# Patient Record
Sex: Male | Born: 1965 | Race: White | Hispanic: No | Marital: Married | Smoking: Never smoker
Health system: Southern US, Community
[De-identification: ages and names within clinical notes are randomized; demographics above are authoritative.]

## PROBLEM LIST (undated history)

## (undated) DIAGNOSIS — Z86711 Personal history of pulmonary embolism: Secondary | ICD-10-CM

## (undated) DIAGNOSIS — I82409 Acute embolism and thrombosis of unspecified deep veins of unspecified lower extremity: Secondary | ICD-10-CM

## (undated) DIAGNOSIS — I4891 Unspecified atrial fibrillation: Secondary | ICD-10-CM

## (undated) HISTORY — PX: VASECTOMY: SHX75

## (undated) HISTORY — PX: NASAL FRACTURE SURGERY: SHX718

---

## 2006-07-23 ENCOUNTER — Other Ambulatory Visit: Payer: Self-pay

## 2006-07-23 ENCOUNTER — Emergency Department: Payer: Self-pay | Admitting: Emergency Medicine

## 2008-02-17 ENCOUNTER — Emergency Department: Payer: Self-pay | Admitting: Emergency Medicine

## 2010-11-05 ENCOUNTER — Emergency Department: Payer: Self-pay | Admitting: Emergency Medicine

## 2013-04-14 ENCOUNTER — Ambulatory Visit: Payer: Self-pay | Admitting: Family Medicine

## 2015-07-21 ENCOUNTER — Other Ambulatory Visit: Payer: Self-pay | Admitting: Family Medicine

## 2015-07-21 DIAGNOSIS — G8929 Other chronic pain: Secondary | ICD-10-CM

## 2015-07-21 DIAGNOSIS — M25512 Pain in left shoulder: Secondary | ICD-10-CM

## 2015-08-10 ENCOUNTER — Ambulatory Visit: Payer: Self-pay

## 2015-08-17 ENCOUNTER — Encounter: Payer: Self-pay | Admitting: Emergency Medicine

## 2015-08-17 ENCOUNTER — Emergency Department: Payer: 59

## 2015-08-17 ENCOUNTER — Emergency Department
Admission: EM | Admit: 2015-08-17 | Discharge: 2015-08-17 | Disposition: A | Discharge status: Home / Self Care | Payer: 59 | Attending: Emergency Medicine | Admitting: Emergency Medicine

## 2015-08-17 DIAGNOSIS — Y998 Other external cause status: Secondary | ICD-10-CM | POA: Insufficient documentation

## 2015-08-17 DIAGNOSIS — S299XXA Unspecified injury of thorax, initial encounter: Secondary | ICD-10-CM | POA: Diagnosis present

## 2015-08-17 DIAGNOSIS — S20211A Contusion of right front wall of thorax, initial encounter: Secondary | ICD-10-CM | POA: Insufficient documentation

## 2015-08-17 DIAGNOSIS — Y9389 Activity, other specified: Secondary | ICD-10-CM | POA: Insufficient documentation

## 2015-08-17 DIAGNOSIS — Y9289 Other specified places as the place of occurrence of the external cause: Secondary | ICD-10-CM | POA: Insufficient documentation

## 2015-08-17 DIAGNOSIS — W132XXA Fall from, out of or through roof, initial encounter: Secondary | ICD-10-CM | POA: Diagnosis not present

## 2015-08-17 MED ORDER — HYDROCODONE-ACETAMINOPHEN 5-325 MG PO TABS
2.0000 | ORAL_TABLET | Freq: Once | ORAL | Status: AC
  Administered 2015-08-17: 2 via ORAL
  Filled 2015-08-17: qty 2

## 2015-08-17 MED ORDER — HYDROCODONE-ACETAMINOPHEN 5-325 MG PO TABS: 1.0000 | ORAL_TABLET | ORAL | Status: DC | PRN

## 2015-08-17 MED ORDER — IBUPROFEN 800 MG PO TABS: 800.0000 mg | ORAL_TABLET | Freq: Three times a day (TID) | ORAL | Status: DC | PRN

## 2015-08-17 MED ORDER — KETOROLAC TROMETHAMINE 60 MG/2ML IM SOLN
60.0000 mg | Freq: Once | INTRAMUSCULAR | Status: AC
  Administered 2015-08-17: 60 mg via INTRAMUSCULAR
  Filled 2015-08-17: qty 2

## 2015-08-17 NOTE — Discharge Instructions (Signed)
Rib Contusion A rib contusion is a deep bruise on your rib area. Contusions are the result of a blunt trauma that causes bleeding and injury to the tissues under the skin. A rib contusion may involve bruising of the ribs and of the skin and muscles in the area. The skin overlying the contusion may turn blue, purple, or yellow. Minor injuries will give you a painless contusion, but more severe contusions may stay painful and swollen for a few weeks. CAUSES  A contusion is usually caused by a blow, trauma, or direct force to an area of the body. This often occurs while playing contact sports. SYMPTOMS  Swelling and redness of the injured area.  Discoloration of the injured area.  Tenderness and soreness of the injured area.  Pain with or without movement. DIAGNOSIS  The diagnosis can be made by taking a medical history and performing a physical exam. An X-ray, CT scan, or MRI may be needed to determine if there were any associated injuries, such as broken bones (fractures) or internal injuries. TREATMENT  Often, the best treatment for a rib contusion is rest. Icing or applying cold compresses to the injured area may help reduce swelling and inflammation. Deep breathing exercises may be recommended to reduce the risk of partial lung collapse and pneumonia. Over-the-counter or prescription medicines may also be recommended for pain control. HOME CARE INSTRUCTIONS   Apply ice to the injured area:  Put ice in a plastic bag.  Place a towel between your skin and the bag.  Leave the ice on for 20 minutes, 2-3 times per day.  Take medicines only as directed by your health care provider.  Rest the injured area. Avoid strenuous activity and any activities or movements that cause pain. Be careful during activities and avoid bumping the injured area.  Perform deep-breathing exercises as directed by your health care provider.  Do not lift anything that is heavier than 5 lb (2.3 kg) until your  health care provider approves.  Do not use any tobacco products, including cigarettes, chewing tobacco, or electronic cigarettes. If you need help quitting, ask your health care provider. SEEK MEDICAL CARE IF:   You have increased bruising or swelling.  You have pain that is not controlled with treatment.  You have a fever. SEEK IMMEDIATE MEDICAL CARE IF:   You have difficulty breathing or shortness of breath.  You develop a continual cough, or you cough up thick or bloody sputum.  You feel sick to your stomach (nauseous), you throw up (vomit), or you have abdominal pain.   This information is not intended to replace advice given to you by your health care provider. Make sure you discuss any questions you have with your health care provider.   Document Released: 05/22/2001 Document Revised: 09/17/2014 Document Reviewed: 06/08/2014 Elsevier Interactive Patient Education 2016 [employer]  

## 2015-08-17 NOTE — ED Provider Notes (Signed)
[facility] Emergency Department Provider Note  ____________________________________________  Time seen: Approximately 9:33 PM  I have reviewed the triage vital signs and the nursing notes.   HISTORY  Chief Complaint Rib Injury    HPI Alexander McRobert W Keirsey Jr. is a 49 y.o. male presents with complaints of right rib pain. Patient states that he fell while working on a roof approximately 4 feet and caught the edge of his house on the right lower anterior rib.   No past medical history on file.  There are no active problems to display for this patient.   Past Surgical History  Procedure Laterality Date  . Nasal fracture surgery    . Vasectomy      Current Outpatient Rx  Name  Route  Sig  Dispense  Refill  . HYDROcodone-acetaminophen (NORCO) 5-325 MG tablet   Oral   Take 1-2 tablets by mouth every 4 (four) hours as needed for moderate pain.   15 tablet   0   . ibuprofen (ADVIL,MOTRIN) 800 MG tablet   Oral   Take 1 tablet (800 mg total) by mouth every 8 (eight) hours as needed.   30 tablet   0     Allergies Review of patient's allergies indicates no known allergies.  No family history on file.  Social History Social History  Substance Use Topics  . Smoking status: Never Smoker   . Smokeless tobacco: Not on file  . Alcohol Use: No    Review of Systems Constitutional: No fever/chills Eyes: No visual changes. ENT: No sore throat. Cardiovascular: Denies chest pain. Respiratory: Denies shortness of breath. Gastrointestinal: No abdominal pain.  No nausea, no vomiting.  No diarrhea.  No constipation. Genitourinary: Negative for dysuria. Musculoskeletal: Negative for back pain. Skin: Negative for rash. Neurological: Negative for headaches, focal weakness or numbness.  10-point ROS otherwise negative.  ____________________________________________   PHYSICAL EXAM:  VITAL SIGNS: ED Triage Vitals  Enc Vitals Group     BP 08/17/15 2103  129/89 mmHg     Pulse Rate 08/17/15 2103 76     Resp 08/17/15 2103 18     Temp 08/17/15 2103 97.8 F (36.6 C)     Temp Source 08/17/15 2103 Oral     SpO2 08/17/15 2103 97 %     Weight 08/17/15 2103 200 lb (90.719 kg)     Height 08/17/15 2103 5\' 10"  (1.778 m)     Head Cir --      Peak Flow --      Pain Score 08/17/15 2104 9     Pain Loc --      Pain Edu? --      Excl. in GC? --     Constitutional: Alert and oriented. Well appearing and in no acute distress. Eyes: Conjunctivae are normal. PERRL. EOMI. Head: Atraumatic. Nose: No congestion/rhinnorhea. Mouth/Throat: Mucous membranes are moist.  Oropharynx non-erythematous. Neck: No stridor.   Cardiovascular: Normal rate, regular rhythm. Grossly normal heart sounds.  Good peripheral circulation. Respiratory: Normal respiratory effort.  No retractions. Lungs CTAB. Gastrointestinal: Soft and nontender. No distention. No abdominal bruits. No CVA tenderness. Musculoskeletal: No lower extremity tenderness nor edema.  No joint effusions. Neurologic:  Normal speech and language. No gross focal neurologic deficits are appreciated. No gait instability. Skin:  Skin is warm, dry and intact. No rash noted. Psychiatric: Mood and affect are normal. Speech and behavior are normal.  ____________________________________________   LABS (all labs ordered are listed, but only abnormal results are displayed)  Labs Reviewed - No data to display ____________________________________________   RADIOLOGY  IMPRESSION: 1. No acute findings. 2. No rib fractures identified. ____________________________________________   PROCEDURES  Procedure(s) performed: None  Critical Care performed: No  ____________________________________________   INITIAL IMPRESSION / ASSESSMENT AND PLAN / ED COURSE  Pertinent labs & imaging results that were available during my care of the patient were reviewed by me and considered in my medical decision making (see  chart for details). Acute right rib contusion. Rx given for hydrocodone 5/325 and ibuprofen 800 mg. Patient to follow up with PCP or return to the ER with any worsening symptomology. Patient voices no other emergency medical complaints at this time. ____________________________________________   FINAL CLINICAL IMPRESSION(S) / ED DIAGNOSES  Final diagnoses:  Rib contusion, right, initial encounter      Evangeline Dakin, PA-C 08/17/15 [id number]  Sharyn Creamer, MD 08/17/15 2354

## 2015-08-22 ENCOUNTER — Ambulatory Visit: Payer: 59

## 2015-08-22 ENCOUNTER — Other Ambulatory Visit: Payer: Self-pay

## 2015-10-28 ENCOUNTER — Ambulatory Visit
Admission: RE | Admit: 2015-10-28 | Discharge: 2015-10-28 | Disposition: A | Discharge status: Home / Self Care | Payer: 59 | Source: Ambulatory Visit | Attending: Family Medicine | Admitting: Family Medicine

## 2015-10-28 DIAGNOSIS — M25512 Pain in left shoulder: Secondary | ICD-10-CM

## 2015-10-28 DIAGNOSIS — X58XXXA Exposure to other specified factors, initial encounter: Secondary | ICD-10-CM | POA: Diagnosis not present

## 2015-10-28 DIAGNOSIS — S43402A Unspecified sprain of left shoulder joint, initial encounter: Secondary | ICD-10-CM | POA: Diagnosis not present

## 2015-10-28 DIAGNOSIS — M19012 Primary osteoarthritis, left shoulder: Secondary | ICD-10-CM | POA: Insufficient documentation

## 2015-10-28 DIAGNOSIS — G8929 Other chronic pain: Secondary | ICD-10-CM

## 2016-06-27 ENCOUNTER — Inpatient Hospital Stay
Admission: EM | Admit: 2016-06-27 | Discharge: 2016-06-29 | DRG: 175 | Disposition: A | Discharge status: Home / Self Care | Payer: 59 | Attending: Internal Medicine | Admitting: Internal Medicine

## 2016-06-27 ENCOUNTER — Other Ambulatory Visit: Payer: Self-pay

## 2016-06-27 ENCOUNTER — Other Ambulatory Visit: Payer: Self-pay | Admitting: Physician Assistant

## 2016-06-27 ENCOUNTER — Encounter: Payer: Self-pay | Admitting: *Deleted

## 2016-06-27 ENCOUNTER — Ambulatory Visit
Admission: RE | Admit: 2016-06-27 | Discharge: 2016-06-27 | Disposition: A | Discharge status: Home / Self Care | Payer: 59 | Source: Ambulatory Visit | Attending: Physician Assistant | Admitting: Physician Assistant

## 2016-06-27 DIAGNOSIS — I82431 Acute embolism and thrombosis of right popliteal vein: Secondary | ICD-10-CM

## 2016-06-27 DIAGNOSIS — I2699 Other pulmonary embolism without acute cor pulmonale: Secondary | ICD-10-CM | POA: Diagnosis present

## 2016-06-27 DIAGNOSIS — I10 Essential (primary) hypertension: Secondary | ICD-10-CM | POA: Diagnosis present

## 2016-06-27 DIAGNOSIS — M7989 Other specified soft tissue disorders: Secondary | ICD-10-CM

## 2016-06-27 DIAGNOSIS — R918 Other nonspecific abnormal finding of lung field: Secondary | ICD-10-CM | POA: Insufficient documentation

## 2016-06-27 DIAGNOSIS — M4854XA Collapsed vertebra, not elsewhere classified, thoracic region, initial encounter for fracture: Secondary | ICD-10-CM

## 2016-06-27 DIAGNOSIS — R0602 Shortness of breath: Secondary | ICD-10-CM | POA: Insufficient documentation

## 2016-06-27 DIAGNOSIS — M79604 Pain in right leg: Secondary | ICD-10-CM

## 2016-06-27 DIAGNOSIS — N179 Acute kidney failure, unspecified: Secondary | ICD-10-CM | POA: Diagnosis present

## 2016-06-27 DIAGNOSIS — I2609 Other pulmonary embolism with acute cor pulmonale: Secondary | ICD-10-CM | POA: Diagnosis present

## 2016-06-27 DIAGNOSIS — D696 Thrombocytopenia, unspecified: Secondary | ICD-10-CM | POA: Diagnosis present

## 2016-06-27 HISTORY — DX: Acute embolism and thrombosis of unspecified deep veins of unspecified lower extremity: I82.409

## 2016-06-27 LAB — BASIC METABOLIC PANEL
ANION GAP: 7 (ref 5–15)
BUN: 24 mg/dL — ABNORMAL HIGH (ref 6–20)
CALCIUM: 9.2 mg/dL (ref 8.9–10.3)
CO2: 26 mmol/L (ref 22–32)
Chloride: 110 mmol/L (ref 101–111)
Creatinine, Ser: 1.4 mg/dL — ABNORMAL HIGH (ref 0.61–1.24)
GFR, EST NON AFRICAN AMERICAN: 58 mL/min — AB (ref >60)
GLUCOSE: 95 mg/dL (ref 65–99)
Potassium: 3.7 mmol/L (ref 3.5–5.1)
Sodium: 143 mmol/L (ref 135–145)

## 2016-06-27 LAB — PROTIME-INR
INR: 1
PROTHROMBIN TIME: 13.2 s (ref 11.4–15.2)

## 2016-06-27 LAB — CBC
HCT: 46.5 % (ref 40.0–52.0)
Hemoglobin: 15.5 g/dL (ref 13.0–18.0)
MCH: 31.7 pg (ref 26.0–34.0)
MCHC: 33.4 g/dL (ref 32.0–36.0)
MCV: 94.9 fL (ref 80.0–100.0)
PLATELETS: 99 10*3/uL — AB (ref 150–440)
RBC: 4.91 MIL/uL (ref 4.40–5.90)
RDW: 14 % (ref 11.5–14.5)
WBC: 5.6 10*3/uL (ref 3.8–10.6)

## 2016-06-27 LAB — APTT: APTT: 57 s — AB (ref 24–36)

## 2016-06-27 LAB — BRAIN NATRIURETIC PEPTIDE: B Natriuretic Peptide: 31 pg/mL (ref 0.0–100.0)

## 2016-06-27 LAB — MAGNESIUM: Magnesium: 2.2 mg/dL (ref 1.7–2.4)

## 2016-06-27 LAB — TROPONIN I: Troponin I: <0.03 ng/mL (ref <0.03)

## 2016-06-27 MED ORDER — IPRATROPIUM-ALBUTEROL 0.5-2.5 (3) MG/3ML IN SOLN: 3.0000 mL | Freq: Four times a day (QID) | RESPIRATORY_TRACT | Status: DC | PRN

## 2016-06-27 MED ORDER — SODIUM CHLORIDE 0.9% FLUSH
3.0000 mL | Freq: Two times a day (BID) | INTRAVENOUS | Status: DC
  Administered 2016-06-28 – 2016-06-29 (×2): 3 mL via INTRAVENOUS

## 2016-06-27 MED ORDER — ALBUTEROL SULFATE (2.5 MG/3ML) 0.083% IN NEBU: 2.5000 mg | INHALATION_SOLUTION | RESPIRATORY_TRACT | Status: DC | PRN

## 2016-06-27 MED ORDER — OXYCODONE-ACETAMINOPHEN 5-325 MG PO TABS
1.0000 | ORAL_TABLET | Freq: Four times a day (QID) | ORAL | Status: DC | PRN
  Administered 2016-06-27 – 2016-06-28 (×2): 1 via ORAL
  Filled 2016-06-27 (×2): qty 1

## 2016-06-27 MED ORDER — SODIUM CHLORIDE 0.9% FLUSH
3.0000 mL | Freq: Two times a day (BID) | INTRAVENOUS | Status: DC
  Administered 2016-06-27 – 2016-06-29 (×3): 3 mL via INTRAVENOUS

## 2016-06-27 MED ORDER — MORPHINE SULFATE (PF) 2 MG/ML IV SOLN
2.0000 mg | INTRAVENOUS | Status: DC | PRN
  Administered 2016-06-27 – 2016-06-28 (×5): 2 mg via INTRAVENOUS
  Filled 2016-06-27 (×5): qty 1

## 2016-06-27 MED ORDER — SODIUM CHLORIDE 0.9% FLUSH: 3.0000 mL | INTRAVENOUS | Status: DC | PRN

## 2016-06-27 MED ORDER — ONDANSETRON HCL 4 MG PO TABS: 4.0000 mg | ORAL_TABLET | Freq: Four times a day (QID) | ORAL | Status: DC | PRN

## 2016-06-27 MED ORDER — AMLODIPINE BESYLATE 5 MG PO TABS
10.0000 mg | ORAL_TABLET | Freq: Every day | ORAL | Status: DC
  Administered 2016-06-28: 10 mg via ORAL
  Filled 2016-06-27 (×2): qty 2

## 2016-06-27 MED ORDER — HYDRALAZINE HCL 20 MG/ML IJ SOLN: 10.0000 mg | Freq: Four times a day (QID) | INTRAMUSCULAR | Status: DC | PRN

## 2016-06-27 MED ORDER — SODIUM CHLORIDE 0.9 % IV SOLN: 250.0000 mL | INTRAVENOUS | Status: DC | PRN

## 2016-06-27 MED ORDER — ACETAMINOPHEN 500 MG PO TABS
1000.0000 mg | ORAL_TABLET | Freq: Once | ORAL | Status: AC
  Administered 2016-06-27: 1000 mg via ORAL
  Filled 2016-06-27: qty 2

## 2016-06-27 MED ORDER — IOPAMIDOL (ISOVUE-370) INJECTION 76%
75.0000 mL | Freq: Once | INTRAVENOUS | Status: AC | PRN
  Administered 2016-06-27: 100 mL via INTRAVENOUS

## 2016-06-27 MED ORDER — ACETAMINOPHEN 325 MG PO TABS
650.0000 mg | ORAL_TABLET | Freq: Four times a day (QID) | ORAL | Status: DC | PRN
  Administered 2016-06-28: 650 mg via ORAL
  Filled 2016-06-27: qty 2

## 2016-06-27 MED ORDER — ONDANSETRON HCL 4 MG/2ML IJ SOLN
4.0000 mg | Freq: Four times a day (QID) | INTRAMUSCULAR | Status: DC | PRN
  Administered 2016-06-28: 4 mg via INTRAVENOUS
  Filled 2016-06-27: qty 2

## 2016-06-27 MED ORDER — OXYCODONE HCL 5 MG PO TABS
5.0000 mg | ORAL_TABLET | Freq: Once | ORAL | Status: AC
  Administered 2016-06-27: 5 mg via ORAL
  Filled 2016-06-27: qty 1

## 2016-06-27 MED ORDER — HEPARIN (PORCINE) IN NACL 100-0.45 UNIT/ML-% IJ SOLN
1600.0000 [IU]/h | INTRAMUSCULAR | Status: DC
  Administered 2016-06-27: 1600 [IU]/h via INTRAVENOUS
  Filled 2016-06-27 (×2): qty 250

## 2016-06-27 MED ORDER — TERBINAFINE HCL 250 MG PO TABS
250.0000 mg | ORAL_TABLET | Freq: Every day | ORAL | Status: DC
  Administered 2016-06-28: 250 mg via ORAL
  Filled 2016-06-27 (×2): qty 1

## 2016-06-27 MED ORDER — RIVAROXABAN 15 MG PO TABS
15.0000 mg | ORAL_TABLET | Freq: Once | ORAL | Status: AC
  Administered 2016-06-27: 15 mg via ORAL
  Filled 2016-06-27: qty 1

## 2016-06-27 MED ORDER — ACETAMINOPHEN 650 MG RE SUPP: 650.0000 mg | Freq: Four times a day (QID) | RECTAL | Status: DC | PRN

## 2016-06-27 NOTE — H&P (Signed)
[facility] [place] [facility]   PATIENT NAME: Alexander [name]    MR#:  [id number]  DATE OF BIRTH:  08/14/1966  DATE OF ADMISSION:  06/27/2016  PRIMARY CARE PHYSICIAN: Marisue Ivan, MD   REQUESTING/REFERRING PHYSICIAN: Nita Sickle, MD  CHIEF COMPLAINT:   Chief Complaint  Patient presents with  . Shortness of Breath   Right leg swelling and tenderness since yesterday. HISTORY OF PRESENT ILLNESS:  Aleksi Brummet  is a 50 y.o. male with No past medical history present to the ED with right leg swelling and tenderness since yesterday. He denies any fever or chills, no chest pain, palpitation, hemoptysis or shortness breath. But he said he has cough and mild shortness of breath on exertion recently. He flew to [place] last month and drives long time everyday. He is physically active. He went to PCPs office due to right leg swelling and tenderness. Venous duplex show right leg DVT. Since he also has a cough and mild shortness breath on exertion, he got CTA which show pulmonary emboli. He was treated with xarelto 1 dose. Pulmonary physician Dr. Welton Flakes suggested admitting patient and get a echocardiograph.  PAST MEDICAL HISTORY:   Past Medical History:  Diagnosis Date  . DVT (deep venous thrombosis) (HCC)     PAST SURGICAL HISTORY:   Past Surgical History:  Procedure Laterality Date  . NASAL FRACTURE SURGERY    . VASECTOMY      SOCIAL HISTORY:   Social History  Substance Use Topics  . Smoking status: Never Smoker  . Smokeless tobacco: Not on file  . Alcohol use No    FAMILY HISTORY:   Family History  Problem Relation Age of Onset  . Heart attack Father   . Stroke Father      DRUG ALLERGIES:  No Known Allergies  REVIEW OF SYSTEMS:   Review of Systems  Constitutional: Negative for chills, fever, malaise/fatigue and weight loss.  HENT: Negative for sore throat.   Eyes: Negative for blurred vision and double vision.  Respiratory:  Positive for cough and shortness of breath. Negative for hemoptysis, sputum production, wheezing and stridor.   Cardiovascular: Negative for chest pain, palpitations, orthopnea and leg swelling.  Gastrointestinal: Negative for abdominal pain, blood in stool, diarrhea, melena, nausea and vomiting.  Genitourinary: Negative for dysuria, frequency and urgency.  Musculoskeletal: Negative for joint pain.       Lead calf swelling and tenderness.  Skin: Negative for rash.  Neurological: Negative for dizziness, focal weakness and loss of consciousness.  Psychiatric/Behavioral: Negative for depression. The patient is not nervous/anxious.     MEDICATIONS AT HOME:   Prior to Admission medications   Medication Sig Start Date End Date Taking? Authorizing Provider  ibuprofen (ADVIL,MOTRIN) 800 MG tablet Take 1 tablet (800 mg total) by mouth every 8 (eight) hours as needed. 08/17/15  Yes Charmayne Sheer Beers, PA-C  Multiple Vitamin (MULTI-VITAMINS) TABS Take 1 tablet by mouth daily.   Yes Historical Provider, MD  terbinafine (LAMISIL) 250 MG tablet Take 250 mg by mouth daily.  05/28/16  Yes Historical Provider, MD      VITAL SIGNS:  Blood pressure (!) 150/97, pulse 78, temperature 98.3 F (36.8 C), temperature source Oral, resp. rate 14, height 5\' 10"  (1.778 m), weight 216 lb (98 kg), SpO2 92 %.  PHYSICAL EXAMINATION:  Physical Exam  GENERAL:  50 y.o.-year-old patient lying in the bed with no acute distress.  EYES: Pupils equal, round, reactive to light and accommodation. No  scleral icterus. Extraocular muscles intact.  HEENT: Head atraumatic, normocephalic. Oropharynx and nasopharynx clear.  NECK:  Supple, no jugular venous distention. No thyroid enlargement, no tenderness.  LUNGS: Normal breath sounds bilaterally, no wheezing, rales,rhonchi or crepitation. No use of accessory muscles of respiration.  CARDIOVASCULAR: S1, S2 normal. No murmurs, rubs, or gallops.  ABDOMEN: Soft, nontender, nondistended.  Bowel sounds present. No organomegaly or mass.  EXTREMITIES: No pedal edema, cyanosis, or clubbing. Right calf tenderness and swelling. NEUROLOGIC: Cranial nerves II through XII are intact. Muscle strength 5/5 in all extremities. Sensation intact. Gait not checked.  PSYCHIATRIC: The patient is alert and oriented x 3.  SKIN: No obvious rash, lesion, or ulcer.   LABORATORY PANEL:   CBC  Recent Labs Lab 06/27/16 1836  WBC 5.6  HGB 15.5  HCT 46.5  PLT 99*   ------------------------------------------------------------------------------------------------------------------  Chemistries   Recent Labs Lab 06/27/16 1836  NA 143  K 3.7  CL 110  CO2 26  GLUCOSE 95  BUN 24*  CREATININE 1.40*  CALCIUM 9.2   ------------------------------------------------------------------------------------------------------------------  Cardiac Enzymes  Recent Labs Lab 06/27/16 1836  TROPONINI <0.03   ------------------------------------------------------------------------------------------------------------------  RADIOLOGY:  Ct Angio Chest Pe W Or Wo Contrast  Result Date: 06/27/2016 CLINICAL DATA:  Acute DVT the popliteal vein in the right lower extremity. Shortness of breath. Leg swelling. EXAM: CT ANGIOGRAPHY CHEST WITH CONTRAST TECHNIQUE: Multidetector CT imaging of the chest was performed using the standard protocol during bolus administration of intravenous contrast. Multiplanar CT image reconstructions and MIPs were obtained to evaluate the vascular anatomy. CONTRAST:  75 mL Isovue 370 COMPARISON:  Chest x-ray 08/17/2015 FINDINGS: Cardiovascular: The heart size is normal. There is prominence of the right ventral compared to the left. Maximal right ventricular measurement is 5.2 cm. This compares with 4.4 cm maximal measurement of the left ventricle. The ratio is 1.18. There is no significant pericardial effusion. Bilateral segmental lower lobe and right middle lobe pulmonary emboli are  present. There is at least 1 segmental pulmonary embolus in the upper lobes bilaterally. Mediastinum/Nodes: No significant mediastinal adenopathy is present. Lungs/Pleura: Lungs are clear. No pleural effusion or pneumothorax. Upper Abdomen: Limited imaging of the upper abdomen is unremarkable. The visualized solid organs are within normal limits. Musculoskeletal: A remote superior endplate fracture is present at T8. Remaining vertebral bodies are intact. No focal lytic or blastic lesions are present. Review of the MIP images confirms the above findings. IMPRESSION: 1. Bilateral segmental pulmonary emboli involving the lower lobes, right middle lobe and to lesser extent upper lobes bilaterally. 2. Positive for acute PE with CT evidence of right heart strain (RV/LV Ratio = 1.18) consistent with at least submassive (intermediate risk) PE. The presence of right heart strain has been associated with an increased risk of morbidity and mortality. 3. No significant pulmonary parenchymal disease. 4. Remote superior endplate compression fracture at T8. 5. Following conversation with Dr. Hyacinth MeekerMiller, and at his request, the patient was taken to the [facility] Emergency Department. These results were called by telephone at the time of interpretation on 06/27/2016 at 5:57 pm to Dr. Bethann PunchesMARK MILLER , who verbally acknowledged these results. Electronically Signed   By: Marin Robertshristopher  Mattern M.D.   On: 06/27/2016 18:06   [place] Venous Img Lower Unilateral Right  Result Date: 06/27/2016 CLINICAL DATA:  Right lower extremity pain and edema for the past day. Evaluate for DVT. EXAM: RIGHT LOWER EXTREMITY VENOUS DOPPLER ULTRASOUND TECHNIQUE: Gray-scale sonography with graded compression, as well as color Doppler  and duplex ultrasound were performed to evaluate the lower extremity deep venous systems from the level of the common femoral vein and including the common femoral, femoral, profunda femoral, popliteal and calf  veins including the posterior tibial, peroneal and gastrocnemius veins when visible. The superficial great saphenous vein was also interrogated. Spectral Doppler was utilized to evaluate flow at rest and with distal augmentation maneuvers in the common femoral, femoral and popliteal veins. COMPARISON:  None. FINDINGS: Contralateral Common Femoral Vein: Respiratory phasicity is normal and symmetric with the symptomatic side. No evidence of thrombus. Normal compressibility. Common Femoral Vein: No evidence of thrombus. Normal compressibility, respiratory phasicity and response to augmentation. Saphenofemoral Junction: No evidence of thrombus. Normal compressibility and flow on color Doppler imaging. Profunda Femoral Vein: No evidence of thrombus. Normal compressibility and flow on color Doppler imaging. Femoral Vein: No evidence of thrombus. Normal compressibility, respiratory phasicity and response to augmentation. Popliteal Vein: There is hypoechoic expansile occlusive thrombus within the right popliteal vein (image 26 and 27), extending to the imaged portion of the right lesser saphenous vein (image 29 and 30). Calf Veins: Nonocclusive thrombus is suspected within the paired peroneal veins (images 35 and 36). Superficial Great Saphenous Vein: No evidence of thrombus. Normal compressibility and flow on color Doppler imaging. Venous Reflux:  None. Other Findings:  None. IMPRESSION: 1. Examination is positive for occlusive DVT within the right popliteal vein extending to involve the paired peroneal veins. 2. Occlusive superficial thrombophlebitis involving the right lesser saphenous vein. Electronically Signed   By: Simonne Come M.D.   On: 06/27/2016 15:57      IMPRESSION AND PLAN:   Pulmonary emboli and right leg DVT The patient will be admitted to medical floor. He was given 1 dose xarelto in ED. I will start heparin drip pharmacy to dose and order echocardiogram per pulmonary physician Dr. Milta Deiters  recommendation.   Acute renal failure. Start normal saline IV and follow-up BMP.  Thrombocytopenia. Unclear etiology, follow-up CBC. Hypertension. Blood pressure is up to 134/102, I will start Norvasc. Hydralazine iv when necessary.  All the records are reviewed and case discussed with ED provider. Management plans discussed with the patient, his wife and they are in agreement.  CODE STATUS: Full code  TOTAL TIME TAKING CARE OF THIS PATIENT: 52 minutes.    Shaune Pollack M.D on 06/27/2016 at 8:58 PM  Between 7am to 6pm - Pager - [phone]  After 6pm go to [email or web address] - Social research officer, government  [facility]  Office  [phone]  CC: Primary care physician; Marisue Ivan, MD   Note: This dictation was prepared with Dragon dictation along with smaller phrase technology. Any transcriptional errors that result from this process are unintentional.

## 2016-06-27 NOTE — Progress Notes (Addendum)
ANTICOAGULATION CONSULT NOTE - Initial Consult  Pharmacy Consult for Heparin  Indication: pulmonary embolus  No Known Allergies  Patient Measurements: Height: 5\' 10"  (177.8 cm) Weight: 216 lb (98 kg) IBW/kg (Calculated) : 73 Heparin Dosing Weight: 93.3 kg   Vital Signs: Temp: 98.3 F (36.8 C) (10/18 1813) Temp Source: Oral (10/18 1813) BP: 134/102 (10/18 2100) Pulse Rate: 71 (10/18 2100)  Labs:  Recent Labs  06/27/16 1836  HGB 15.5  HCT 46.5  PLT 99*  CREATININE 1.40*  TROPONINI <0.03    Estimated Creatinine Clearance: 74.9 mL/min (by C-G formula based on SCr of 1.4 mg/dL (H)).   Medical History: Past Medical History:  Diagnosis Date  . DVT (deep venous thrombosis) (HCC)     Medications:   (Not in a hospital admission)  Assessment: CrCl = 74.9 ml/min  Pt received Xarelto 15 mg PO X 1 in ED on 10/18 @ 20:00 .   Goal of Therapy:  Heparin level 0.3-0.7 units/ml aPTT 68 - 109  seconds Monitor platelets by anticoagulation protocol: Yes   Plan:  Will not bolus this pt.  Start heparin infusion at 1600  units/hr. Will draw aPTT on 10/19 @ 03:30. Will draw HL on 10/19 @ 03:30.   Will use aPTT to guide dosing until aPTT and HL converge.    Alexander [name],Alexander [name] 06/27/2016,9:05 PM

## 2016-06-27 NOTE — ED Triage Notes (Signed)
Pt brought over from CT scan, pt states he has a known DVT in his right lower leg and has been SOB lately so he was evaluated for PE, to be started on xarelto, pt awake and alert in no acute distress

## 2016-06-27 NOTE — Progress Notes (Signed)
Pt still complaining of right leg pain from the DVT, pt states its burning and sore, rates it a 7 out of 10 and has stayed that way with the pain medication he received in the ED. MD paged, Dr. Anne HahnWillis to put in orders for Morphine for severe pain. Will continue to monitor. Shirley FriarAlexis Miller, RN

## 2016-06-27 NOTE — ED Notes (Signed)
Lab called to add on PTT and PT-INR

## 2016-06-27 NOTE — ED Provider Notes (Signed)
[facility] Emergency Department Provider Note  ____________________________________________  Time seen: Approximately 6:32 PM  I have reviewed the triage vital signs and the nursing notes.   HISTORY  Chief Complaint Shortness of Breath   HPI Alexander Tse. is a 50 y.o. male with no significant past medical history who presents to the emergency room for new diagnosis of DVT and PE. Patient was sent by his primary care doctor to have Doppler and a CTA of the chest. They were both positive and he was sent over here from radiology for evaluation. Patient already has his prescription for Xarelto with him. Patient reports that in the beginning of September he flew 10.5 hours to [place]. Since returning he noticed slight shortness of breath when he is working out. Patient reports that he works out heavily every day of the week, take spin classes and runs. He noticed that after 10 flights of stairs he started feeling mild shortness of breath which was atypical for him. No CP, no SOB at rest or mild daily activities. Yesterday he noticed his right lower extremity was swollen and painful which prompted him to see his PCP. Patient denies any personal or family history of blood clots. 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 Patient reports strong family history of ischemic heart disease and stroke and reports that all the males in his family have died in their 81s.   Past Medical History:  Diagnosis Date  . DVT (deep venous thrombosis) (HCC)     There are no active problems to display for this patient.   Past Surgical History:  Procedure Laterality Date  . NASAL FRACTURE SURGERY    . VASECTOMY      Prior to Admission medications   Medication Sig Start Date End Date Taking? Authorizing Provider  HYDROcodone-acetaminophen (NORCO) 5-325 MG tablet Take 1-2 tablets by mouth every 4 (four) hours as needed for moderate pain. 08/17/15   Charmayne Sheer  Beers, PA-C  ibuprofen (ADVIL,MOTRIN) 800 MG tablet Take 1 tablet (800 mg total) by mouth every 8 (eight) hours as needed. 08/17/15   Evangeline Dakin, PA-C    Allergies Review of patient's allergies indicates no known allergies.  History reviewed. No pertinent family history.  Social History Social History  Substance Use Topics  . Smoking status: Never Smoker  . Smokeless tobacco: Not on file  . Alcohol use No    Review of Systems  Constitutional: Negative for fever. Eyes: Negative for visual changes. ENT: Negative for sore throat. Cardiovascular: Negative for chest pain. Respiratory: Negative for shortness of breath. Gastrointestinal: Negative for abdominal pain, vomiting or diarrhea. Genitourinary: Negative for dysuria. Musculoskeletal: Negative for back pain. + RLE swelling Skin: Negative for rash. Neurological: Negative for headaches, weakness or numbness.  ____________________________________________   PHYSICAL EXAM:  VITAL SIGNS: ED Triage Vitals  Enc Vitals Group     BP 06/27/16 1813 (!) 143/92     Pulse Rate 06/27/16 1813 81     Resp 06/27/16 1813 18     Temp 06/27/16 1813 98.3 F (36.8 C)     Temp Source 06/27/16 1813 Oral     SpO2 06/27/16 1813 98 %     Weight 06/27/16 1813 216 lb (98 kg)     Height 06/27/16 1813 5\' 10"  (1.778 m)     Head Circumference --      Peak Flow --      Pain Score 06/27/16 1814 7  Pain Loc --      Pain Edu? --      Excl. in GC? --     Constitutional: Alert and oriented. Well appearing and in no apparent distress. HEENT:      Head: Normocephalic and atraumatic.         Eyes: Conjunctivae are normal. Sclera is non-icteric. EOMI. PERRL      Mouth/Throat: Mucous membranes are moist.       Neck: Supple with no signs of meningismus. Cardiovascular: Regular rate and rhythm. No murmurs, gallops, or rubs. 2+ symmetrical distal pulses are present in all extremities. No JVD. Respiratory: Normal respiratory effort. Lungs are clear  to auscultation bilaterally. No wheezes, crackles, or rhonchi.  Gastrointestinal: Soft, non tender, and non distended with positive bowel sounds. No rebound or guarding. Genitourinary: No CVA tenderness. Musculoskeletal: 1+ pitting edema and asymmetric swelling of the RLE Neurologic: Normal speech and language. Face is symmetric. Moving all extremities. No gross focal neurologic deficits are appreciated. Skin: Skin is warm, dry and intact. No rash noted. Psychiatric: Mood and affect are normal. Speech and behavior are normal.  ____________________________________________   LABS (all labs ordered are listed, but only abnormal results are displayed)  Labs Reviewed  CBC - Abnormal; Notable for the following:       Result Value   Platelets 99 (*)    All other components within normal limits  BASIC METABOLIC PANEL - Abnormal; Notable for the following:    BUN 24 (*)    Creatinine, Ser 1.40 (*)    GFR calc non Af Amer 58 (*)    All other components within normal limits  TROPONIN I  BRAIN NATRIURETIC PEPTIDE   ____________________________________________  EKG  ED ECG REPORT I, Nita Sickle, the attending physician, personally viewed and interpreted this ECG. Normal sinus rhythm, rate of 72, normal intervals, normal axis, no ST elevations or depressions. Normal EKG ____________________________________________  RADIOLOGY  CTA chest:  1. Bilateral segmental pulmonary emboli involving the lower lobes, right middle lobe and to lesser extent upper lobes bilaterally. 2. Positive for acute PE with CT evidence of right heart strain (RV/LV Ratio = 1.18) consistent with at least submassive (intermediate risk) PE. The presence of right heart strain has been associated with an increased risk of morbidity and mortality. 3. No significant pulmonary parenchymal disease.   Doppler US: 1. Examination is positive for occlusive DVT within the right popliteal vein extending to involve the  paired peroneal veins. 2. Occlusive superficial thrombophlebitis involving the right lesser saphenous vein.  ____________________________________________   PROCEDURES  Procedure(s) performed: None Procedures Critical Care performed:  Yes  CRITICAL CARE Performed by: Nita Sickle  ?  Total critical care time: 35 min  Critical care time was exclusive of separately billable procedures and treating other patients.  Critical care was necessary to treat or prevent imminent or life-threatening deterioration.  Critical care was time spent personally by me on the following activities: development of treatment plan with patient and/or surrogate as well as nursing, discussions with consultants, evaluation of patient's response to treatment, examination of patient, obtaining history from patient or surrogate, ordering and performing treatments and interventions, ordering and review of laboratory studies, ordering and review of radiographic studies, pulse oximetry and re-evaluation of patient's condition.  ____________________________________________   INITIAL IMPRESSION / ASSESSMENT AND PLAN / ED COURSE  50 y.o. male with no significant past medical history who presents to the emergency room for new diagnosis of DVT and PE. Patient complaining of pain  in his leg but has no chest pain or shortness of breath. His vital signs are within normal limits. CTA showing bilateral segmental pulmonary emboli. Will check EKG, troponin, and BNP for signs of R heart strain. Will give first dose of Xarelto here. Will give tylenol  and oxycodone  for leg pain.  Clinical Course  Comment By Time  CT concerning for Right heart strain. EKG, BNP, and troponin normal. VS normal, no CP or SOB. Spoke with Dr. Welton Flakes, pulmonologist that recommended admission to hospital on heparin gtt, telemetry and ECHO to evaluate RV function due to clot burden seen on CT. Patient is comfortable with admission. Will start  heparin and call hospitalist. Nita Sickle, MD 10/18 [phone]    Pertinent labs & imaging results that were available during my care of the patient were reviewed by me and considered in my medical decision making (see chart for details).    ____________________________________________   FINAL CLINICAL IMPRESSION(S) / ED DIAGNOSES  Final diagnoses:  Other acute pulmonary embolism without acute cor pulmonale (HCC)  Acute deep vein thrombosis (DVT) of popliteal vein of right lower extremity (HCC)      NEW MEDICATIONS STARTED DURING THIS VISIT:  New Prescriptions   No medications on file     Note:  This document was prepared using Dragon voice recognition software and may include unintentional dictation errors.    Nita Sickle, MD 06/27/16 2120

## 2016-06-28 ENCOUNTER — Inpatient Hospital Stay
Admit: 2016-06-28 | Discharge: 2016-06-28 | Disposition: A | Discharge status: Home / Self Care | Payer: 59 | Attending: Internal Medicine | Admitting: Internal Medicine

## 2016-06-28 LAB — CBC
HEMATOCRIT: 45.9 % (ref 40.0–52.0)
HEMOGLOBIN: 15.8 g/dL (ref 13.0–18.0)
MCH: 31.9 pg (ref 26.0–34.0)
MCHC: 34.4 g/dL (ref 32.0–36.0)
MCV: 92.8 fL (ref 80.0–100.0)
Platelets: 89 10*3/uL — ABNORMAL LOW (ref 150–440)
RBC: 4.94 MIL/uL (ref 4.40–5.90)
RDW: 13.5 % (ref 11.5–14.5)
WBC: 5.8 10*3/uL (ref 3.8–10.6)

## 2016-06-28 LAB — ECHOCARDIOGRAM COMPLETE
HEIGHTINCHES: 70 in
WEIGHTICAEL: 3468.8 [oz_av]

## 2016-06-28 LAB — APTT: aPTT: >160 seconds (ref 24–36)

## 2016-06-28 LAB — HEPARIN LEVEL (UNFRACTIONATED): Heparin Unfractionated: 2.38 IU/mL — ABNORMAL HIGH (ref 0.30–0.70)

## 2016-06-28 MED ORDER — RIVAROXABAN 15 MG PO TABS
15.0000 mg | ORAL_TABLET | Freq: Two times a day (BID) | ORAL | Status: DC
  Filled 2016-06-28: qty 1

## 2016-06-28 MED ORDER — ENOXAPARIN SODIUM 100 MG/ML ~~LOC~~ SOLN: 100.0000 mg | Freq: Two times a day (BID) | SUBCUTANEOUS | 0 refills | Status: DC

## 2016-06-28 MED ORDER — WARFARIN SODIUM 5 MG PO TABS: 5.0000 mg | ORAL_TABLET | Freq: Every day | ORAL | 0 refills | Status: DC

## 2016-06-28 MED ORDER — AMLODIPINE BESYLATE 10 MG PO TABS: 10.0000 mg | ORAL_TABLET | Freq: Every day | ORAL | 0 refills | Status: DC

## 2016-06-28 MED ORDER — ENOXAPARIN SODIUM 100 MG/ML ~~LOC~~ SOLN
1.0000 mg/kg | Freq: Two times a day (BID) | SUBCUTANEOUS | Status: DC
  Administered 2016-06-28 – 2016-06-29 (×2): 100 mg via SUBCUTANEOUS
  Filled 2016-06-28 (×2): qty 1

## 2016-06-28 MED ORDER — WARFARIN - PHYSICIAN DOSING INPATIENT
Freq: Every day | Status: DC
  Administered 2016-06-28: 18:00:00

## 2016-06-28 MED ORDER — WARFARIN SODIUM 5 MG PO TABS: 5.0000 mg | ORAL_TABLET | Freq: Every day | ORAL | Status: DC

## 2016-06-28 MED ORDER — ENOXAPARIN SODIUM 100 MG/ML ~~LOC~~ SOLN: 1.0000 mg/kg | Freq: Two times a day (BID) | SUBCUTANEOUS | Status: DC

## 2016-06-28 MED ORDER — SUMATRIPTAN SUCCINATE 50 MG PO TABS
50.0000 mg | ORAL_TABLET | ORAL | Status: DC | PRN
  Administered 2016-06-28: 50 mg via ORAL
  Filled 2016-06-28 (×3): qty 1

## 2016-06-28 MED ORDER — RIVAROXABAN (XARELTO) VTE STARTER PACK (15 & 20 MG): ORAL_TABLET | ORAL | 0 refills | Status: DC

## 2016-06-28 MED ORDER — HEPARIN (PORCINE) IN NACL 100-0.45 UNIT/ML-% IJ SOLN
1400.0000 [IU]/h | INTRAMUSCULAR | Status: DC
  Administered 2016-06-28: 1400 [IU]/h via INTRAVENOUS

## 2016-06-28 MED ORDER — PROMETHAZINE HCL 25 MG/ML IJ SOLN
25.0000 mg | Freq: Four times a day (QID) | INTRAMUSCULAR | Status: DC | PRN
  Administered 2016-06-28: 25 mg via INTRAVENOUS
  Filled 2016-06-28: qty 1

## 2016-06-28 MED ORDER — WARFARIN SODIUM 5 MG PO TABS
10.0000 mg | ORAL_TABLET | Freq: Every day | ORAL | Status: DC
  Administered 2016-06-28: 10 mg via ORAL
  Filled 2016-06-28: qty 2

## 2016-06-28 NOTE — Progress Notes (Signed)
Dr. Sheryle Hailiamond notified of critical aPTT  Of >160. No new orders.

## 2016-06-28 NOTE — Progress Notes (Signed)
Heparin gtt restarted at 1400 units/hr.  Zofran 4mg  iv given for complaints of nausea, will monitor.

## 2016-06-28 NOTE — Progress Notes (Signed)
Dr. Elisabeth PigeonVachhani paged re: pt's "migraine", for new orders

## 2016-06-28 NOTE — Consult Note (Addendum)
ANTICOAGULATION CONSULT NOTE - Initial Consult  Pharmacy Consult for enoxaparin Indication: pulmonary embolus and DVT  No Known Allergies  Patient Measurements: Height: 5\' 10"  (177.8 cm) Weight: 216 lb 12.8 oz (98.3 kg) IBW/kg (Calculated) : 73 Heparin Dosing Weight:   Vital Signs: Temp: 98.4 F (36.9 C) (10/19 0735) Temp Source: Oral (10/19 0735) BP: 139/93 (10/19 0735) Pulse Rate: 63 (10/19 0735)  Labs:  Recent Labs  06/27/16 1836 06/28/16 0412 06/28/16 1350  HGB 15.5  --  15.8  HCT 46.5  --  45.9  PLT 99*  --  89*  APTT 57* >160* >160*  LABPROT 13.2  --   --   INR 1.00  --   --   HEPARINUNFRC  --  2.38*  --   CREATININE 1.40*  --   --   TROPONINI <0.03  --   --     Estimated Creatinine Clearance: 75 mL/min (by C-G formula based on SCr of 1.4 mg/dL (H)).   Medical History: Past Medical History:  Diagnosis Date  . DVT (deep venous thrombosis) (HCC)     Medications:  Scheduled:  . amLODipine  10 mg Oral Daily  . sodium chloride flush  3 mL Intravenous Q12H  . sodium chloride flush  3 mL Intravenous Q12H  . terbinafine  250 mg Oral Daily  . warfarin  5 mg Oral q1800    Assessment: Pt is a 50 year old male found to have DVT/PE. Pt was initally started on xarelto, then heparin drip. Dr. Park BreedKahn would like to start pt on enoxaparin bridge and start warfarin. Pt is to be d/c today with follow up with Dr Welton FlakesKhan in a few days.  Goal of Therapy:  INR 2-3 Monitor platelets by anticoagulation protocol: Yes   Plan:  Lovenox 1mg /kg q 12 hrs until INR therapeutic x 2. Patient should follow up with INR check in 3 days. Also note that pt has thrombocytopenia plt 89 this afternoon and should have a CBC checked when he has his INR checked.  Melissa D Maccia 06/28/2016,3:16 PM

## 2016-06-28 NOTE — Progress Notes (Signed)
[employer] [facility] [place] [facility]   PATIENT NAME: Alexander [name]    MR#:  [id number]  DATE OF BIRTH:  30-Mar-1966  SUBJECTIVE:  CHIEF COMPLAINT:   Chief Complaint  Patient presents with  . Shortness of Breath     K May shortness of breath and leg swelling, found having DVT and massive PE. Monitored on telemetry with IV heparin drip. Feels completely back to baseline today after having severe headache and nausea in the morning but improved with medications and having to nap now.  REVIEW OF SYSTEMS:  CONSTITUTIONAL: No fever, fatigue or weakness.  EYES: No blurred or double vision.  EARS, NOSE, AND THROAT: No tinnitus or ear pain.  RESPIRATORY: No cough, shortness of breath, wheezing or hemoptysis.  CARDIOVASCULAR: Positive for chest pain, no orthopnea, edema.  GASTROINTESTINAL: No nausea, vomiting, diarrhea or abdominal pain.  GENITOURINARY: No dysuria, hematuria.  ENDOCRINE: No polyuria, nocturia,  HEMATOLOGY: No anemia, easy bruising or bleeding SKIN: No rash or lesion. MUSCULOSKELETAL: No joint pain or arthritis. Right leg swelling.  NEUROLOGIC: No tingling, numbness, weakness.  PSYCHIATRY: No anxiety or depression.   ROS  DRUG ALLERGIES:  No Known Allergies  VITALS:  Blood pressure (!) 139/93, pulse 63, temperature 98.4 F (36.9 C), temperature source Oral, resp. rate 16, height 5\' 10"  (1.778 m), weight 98.3 kg (216 lb 12.8 oz), SpO2 95 %.  PHYSICAL EXAMINATION:  GENERAL:  50 y.o.-year-old patient lying in the bed with no acute distress.  EYES: Pupils equal, round, reactive to light and accommodation. No scleral icterus. Extraocular muscles intact.  HEENT: Head atraumatic, normocephalic. Oropharynx and nasopharynx clear.  NECK:  Supple, no jugular venous distention. No thyroid enlargement, no tenderness.  LUNGS: Normal breath sounds bilaterally, no wheezing, rales,rhonchi or crepitation. No use of accessory muscles of respiration.  CARDIOVASCULAR: S1, S2  normal. No murmurs, rubs, or gallops.  ABDOMEN: Soft, nontender, nondistended. Bowel sounds present. No organomegaly or mass.  EXTREMITIES: No pedal edema, cyanosis, or clubbing.  NEUROLOGIC: Cranial nerves II through XII are intact. Muscle strength 5/5 in all extremities. Sensation intact. Gait not checked.  PSYCHIATRIC: The patient is alert and oriented x 3.  SKIN: No obvious rash, lesion, or ulcer.   Physical Exam LABORATORY PANEL:   CBC  Recent Labs Lab 06/28/16 1350  WBC 5.8  HGB 15.8  HCT 45.9  PLT 89*   ------------------------------------------------------------------------------------------------------------------  Chemistries   Recent Labs Lab 06/27/16 1836  NA 143  K 3.7  CL 110  CO2 26  GLUCOSE 95  BUN 24*  CREATININE 1.40*  CALCIUM 9.2  MG 2.2   ------------------------------------------------------------------------------------------------------------------  Cardiac Enzymes  Recent Labs Lab 06/27/16 1836  TROPONINI <0.03   ------------------------------------------------------------------------------------------------------------------  RADIOLOGY:  Ct Angio Chest Pe W Or Wo Contrast  Result Date: 06/27/2016 CLINICAL DATA:  Acute DVT the popliteal vein in the right lower extremity. Shortness of breath. Leg swelling. EXAM: CT ANGIOGRAPHY CHEST WITH CONTRAST TECHNIQUE: Multidetector CT imaging of the chest was performed using the standard protocol during bolus administration of intravenous contrast. Multiplanar CT image reconstructions and MIPs were obtained to evaluate the vascular anatomy. CONTRAST:  75 mL Isovue 370 COMPARISON:  Chest x-ray 08/17/2015 FINDINGS: Cardiovascular: The heart size is normal. There is prominence of the right ventral compared to the left. Maximal right ventricular measurement is 5.2 cm. This compares with 4.4 cm maximal measurement of the left ventricle. The ratio is 1.18. There is no significant pericardial effusion.  Bilateral segmental lower lobe and right middle  lobe pulmonary emboli are present. There is at least 1 segmental pulmonary embolus in the upper lobes bilaterally. Mediastinum/Nodes: No significant mediastinal adenopathy is present. Lungs/Pleura: Lungs are clear. No pleural effusion or pneumothorax. Upper Abdomen: Limited imaging of the upper abdomen is unremarkable. The visualized solid organs are within normal limits. Musculoskeletal: A remote superior endplate fracture is present at T8. Remaining vertebral bodies are intact. No focal lytic or blastic lesions are present. Review of the MIP images confirms the above findings. IMPRESSION: 1. Bilateral segmental pulmonary emboli involving the lower lobes, right middle lobe and to lesser extent upper lobes bilaterally. 2. Positive for acute PE with CT evidence of right heart strain (RV/LV Ratio = 1.18) consistent with at least submassive (intermediate risk) PE. The presence of right heart strain has been associated with an increased risk of morbidity and mortality. 3. No significant pulmonary parenchymal disease. 4. Remote superior endplate compression fracture at T8. 5. Following conversation with Dr. Hyacinth Meeker, and at his request, the patient was taken to the [facility] Emergency Department. These results were called by telephone at the time of interpretation on 06/27/2016 at 5:57 pm to Dr. Bethann Punches , who verbally acknowledged these results. Electronically Signed   By: Marin Roberts M.D.   On: 06/27/2016 18:06   US Venous Img Lower Unilateral Right  Result Date: 06/27/2016 CLINICAL DATA:  Right lower extremity pain and edema for the past day. Evaluate for DVT. EXAM: RIGHT LOWER EXTREMITY VENOUS DOPPLER ULTRASOUND TECHNIQUE: Gray-scale sonography with graded compression, as well as color Doppler and duplex ultrasound were performed to evaluate the lower extremity deep venous systems from the level of the common femoral vein and  including the common femoral, femoral, profunda femoral, popliteal and calf veins including the posterior tibial, peroneal and gastrocnemius veins when visible. The superficial great saphenous vein was also interrogated. Spectral Doppler was utilized to evaluate flow at rest and with distal augmentation maneuvers in the common femoral, femoral and popliteal veins. COMPARISON:  None. FINDINGS: Contralateral Common Femoral Vein: Respiratory phasicity is normal and symmetric with the symptomatic side. No evidence of thrombus. Normal compressibility. Common Femoral Vein: No evidence of thrombus. Normal compressibility, respiratory phasicity and response to augmentation. Saphenofemoral Junction: No evidence of thrombus. Normal compressibility and flow on color Doppler imaging. Profunda Femoral Vein: No evidence of thrombus. Normal compressibility and flow on color Doppler imaging. Femoral Vein: No evidence of thrombus. Normal compressibility, respiratory phasicity and response to augmentation. Popliteal Vein: There is hypoechoic expansile occlusive thrombus within the right popliteal vein (image 26 and 27), extending to the imaged portion of the right lesser saphenous vein (image 29 and 30). Calf Veins: Nonocclusive thrombus is suspected within the paired peroneal veins (images 35 and 36). Superficial Great Saphenous Vein: No evidence of thrombus. Normal compressibility and flow on color Doppler imaging. Venous Reflux:  None. Other Findings:  None. IMPRESSION: 1. Examination is positive for occlusive DVT within the right popliteal vein extending to involve the paired peroneal veins. 2. Occlusive superficial thrombophlebitis involving the right lesser saphenous vein. Electronically Signed   By: Simonne Come M.D.   On: 06/27/2016 15:57    ASSESSMENT AND PLAN:   Principal Problem:   Pulmonary emboli (HCC)  * Pulmonary emboli and right leg DVT   IV heparin drip.   Echocardiogram done, no strain on the right side of  the heart.   Initially I planned to discharge on oral xarelto, but pulmonologist Dr. Park Breed suggested to give Lovenox plus warfarin  as he had massive PE.   As he also had thrombocytopenia suggested to watch one more day in hospital because he received heparin IV and will get Lovenox now.   Patient and his family were informed about the plan and they understand and agree.  * Acute renal failure   Monitor renal function with IV fluid.  * Thrombocytopenia     Unclear etiology, monitor tomorrow.  * Hypertension   Continue amlodipine.    All the records are reviewed and case discussed with Care Management/Social Workerr. Management plans discussed with the patient, family and they are in agreement.  CODE STATUS: full  TOTAL TIME TAKING CARE OF THIS PATIENT: 35 minutes.     POSSIBLE D/C IN 1-2 DAYS, DEPENDING ON CLINICAL CONDITION.   Altamese DillingVACHHANI, Lenette Rau M.D on 06/28/2016   Between 7am to 6pm - Pager - [phone]  After 6pm go to [email or web address] - password [email or web address]  [facility]  Office  [phone]  CC: Primary care physician; Marisue IvanLINTHAVONG, KANHKA, MD  Note: This dictation was prepared with Dragon dictation along with smaller phrase technology. Any transcriptional errors that result from this process are unintentional.

## 2016-06-28 NOTE — Consult Note (Signed)
Pulmonary Critical Care  Initial Consult Note   Alexander [name]. ZOX:[id number] DOB: [birth year] DOA: 06/27/2016  Referring physician: Shaune Pollack, MD PCP: Marisue Ivan, MD   Chief Complaint: PE  HPI: Alexander [name]. is a 50 y.o. male with no significant medical history who presented to the ED with right leg swelling and tenderness. He had been seen by his PCP and was found to have DVT by venous duplex. Patient was initially started on xarelto. Patient Came into the ED with shortness of breath which was mild. He had no chest pain noted. Patient had no dizziness or syncope noted. Patient does have a mild cough noted. Patient had a CT angio done and this revealed presence of bilateral PE. There was also some concern of right heart strain based on the CT findings. An echo was not immediately available. He did have the echo done today and this shows no RV strain and no elevation of PA pressures. Patient as noted was hemodynamically stable so thrombolytics were deferred based on risk and benefits. He is admitted for anticoagulation and monitoring.   Review of Systems:  Constitutional:  No weight loss, night sweats, Fevers, chills, fatigue.  HEENT:  No headaches, nasal congestion, post nasal drip,  Cardio-vascular:  No chest pain, no Orthopnea, PND, +swelling in lower extremities, no dizziness, palpitations  GI:  No heartburn, indigestion, abdominal pain, nausea, vomiting, diarrhea  Resp:  +shortness of breath with exertion. no productive cough, No coughing up of blood.No wheezing Skin:  no rash or lesions.  Musculoskeletal:  No joint pain or swelling.   Remainder ROS performed and is unremarkable other than noted in HPI  Past Medical History:  Diagnosis Date  . DVT (deep venous thrombosis) (HCC)    Past Surgical History:  Procedure Laterality Date  . NASAL FRACTURE SURGERY    . VASECTOMY     Social History:  reports that he has never smoked. He has never used smokeless  tobacco. He reports that he does not drink alcohol or use drugs.  No Known Allergies  Family History  Problem Relation Age of Onset  . Heart attack Father   . Stroke Father     Prior to Admission medications   Medication Sig Start Date End Date Taking? Authorizing Provider  ibuprofen (ADVIL,MOTRIN) 800 MG tablet Take 1 tablet (800 mg total) by mouth every 8 (eight) hours as needed. 08/17/15  Yes Charmayne Sheer Beers, PA-C  Multiple Vitamin (MULTI-VITAMINS) TABS Take 1 tablet by mouth daily.   Yes Historical Provider, MD  terbinafine (LAMISIL) 250 MG tablet Take 250 mg by mouth daily.  05/28/16  Yes Historical Provider, MD   Physical Exam: Vitals:   06/27/16 2233 06/27/16 2255 06/28/16 0434 06/28/16 0735  BP: (!) 137/96 121/83 132/84 (!) 139/93  Pulse: 72 77 68 63  Resp: 18 15 18 16   Temp:  97.8 F (36.6 C) 97.9 F (36.6 C) 98.4 F (36.9 C)  TempSrc:  Oral Oral Oral  SpO2: 95% 97% 94% 95%  Weight:  98.3 kg (216 lb 12.8 oz)    Height:  5\' 10"  (1.778 m)      Wt Readings from Last 3 Encounters:  06/27/16 98.3 kg (216 lb 12.8 oz)  08/17/15 90.7 kg (200 lb)    General:  Appears calm and comfortable Eyes: PERRL, normal lids, irises & conjunctiva ENT: grossly normal hearing, lips & tongue Neck: no LAD, masses or thyromegaly Cardiovascular: RRR, no m/r/g. +R LE edema. Respiratory: CTA bilaterally, no  w/r/r. Normal respiratory effort. Abdomen: soft, nontender Skin: no rash or induration seen on limited exam Musculoskeletal: grossly normal tone BUE/BLE Psychiatric: grossly normal mood and affect Neurologic: grossly non-focal.          Labs on Admission:  Basic Metabolic Panel:  Recent Labs Lab 06/27/16 1836  NA 143  K 3.7  CL 110  CO2 26  GLUCOSE 95  BUN 24*  CREATININE 1.40*  CALCIUM 9.2  MG 2.2   Liver Function Tests: No results for input(s): AST, ALT, ALKPHOS, BILITOT, PROT, ALBUMIN in the last 168 hours. No results for input(s): LIPASE, AMYLASE in the last 168  hours. No results for input(s): AMMONIA in the last 168 hours. CBC:  Recent Labs Lab 06/27/16 1836  WBC 5.6  HGB 15.5  HCT 46.5  MCV 94.9  PLT 99*   Cardiac Enzymes:  Recent Labs Lab 06/27/16 1836  TROPONINI <0.03    BNP (last 3 results)  Recent Labs  06/27/16 1836  BNP 31.0    ProBNP (last 3 results) No results for input(s): PROBNP in the last 8760 hours.  CBG: No results for input(s): GLUCAP in the last 168 hours.  Radiological Exams on Admission: Ct Angio Chest Pe W Or Wo Contrast  Result Date: 06/27/2016 CLINICAL DATA:  Acute DVT the popliteal vein in the right lower extremity. Shortness of breath. Leg swelling. EXAM: CT ANGIOGRAPHY CHEST WITH CONTRAST TECHNIQUE: Multidetector CT imaging of the chest was performed using the standard protocol during bolus administration of intravenous contrast. Multiplanar CT image reconstructions and MIPs were obtained to evaluate the vascular anatomy. CONTRAST:  75 mL Isovue 370 COMPARISON:  Chest x-ray 08/17/2015 FINDINGS: Cardiovascular: The heart size is normal. There is prominence of the right ventral compared to the left. Maximal right ventricular measurement is 5.2 cm. This compares with 4.4 cm maximal measurement of the left ventricle. The ratio is 1.18. There is no significant pericardial effusion. Bilateral segmental lower lobe and right middle lobe pulmonary emboli are present. There is at least 1 segmental pulmonary embolus in the upper lobes bilaterally. Mediastinum/Nodes: No significant mediastinal adenopathy is present. Lungs/Pleura: Lungs are clear. No pleural effusion or pneumothorax. Upper Abdomen: Limited imaging of the upper abdomen is unremarkable. The visualized solid organs are within normal limits. Musculoskeletal: A remote superior endplate fracture is present at T8. Remaining vertebral bodies are intact. No focal lytic or blastic lesions are present. Review of the MIP images confirms the above findings. IMPRESSION:  1. Bilateral segmental pulmonary emboli involving the lower lobes, right middle lobe and to lesser extent upper lobes bilaterally. 2. Positive for acute PE with CT evidence of right heart strain (RV/LV Ratio = 1.18) consistent with at least submassive (intermediate risk) PE. The presence of right heart strain has been associated with an increased risk of morbidity and mortality. 3. No significant pulmonary parenchymal disease. 4. Remote superior endplate compression fracture at T8. 5. Following conversation with Dr. Hyacinth Meeker, and at his request, the patient was taken to the [facility] Emergency Department. These results were called by telephone at the time of interpretation on 06/27/2016 at 5:57 pm to Dr. Bethann Punches , who verbally acknowledged these results. Electronically Signed   By: Marin Roberts M.D.   On: 06/27/2016 18:06   US Venous Img Lower Unilateral Right  Result Date: 06/27/2016 CLINICAL DATA:  Right lower extremity pain and edema for the past day. Evaluate for DVT. EXAM: RIGHT LOWER EXTREMITY VENOUS DOPPLER ULTRASOUND TECHNIQUE: Gray-scale sonography with graded  compression, as well as color Doppler and duplex ultrasound were performed to evaluate the lower extremity deep venous systems from the level of the common femoral vein and including the common femoral, femoral, profunda femoral, popliteal and calf veins including the posterior tibial, peroneal and gastrocnemius veins when visible. The superficial great saphenous vein was also interrogated. Spectral Doppler was utilized to evaluate flow at rest and with distal augmentation maneuvers in the common femoral, femoral and popliteal veins. COMPARISON:  None. FINDINGS: Contralateral Common Femoral Vein: Respiratory phasicity is normal and symmetric with the symptomatic side. No evidence of thrombus. Normal compressibility. Common Femoral Vein: No evidence of thrombus. Normal compressibility, respiratory phasicity and  response to augmentation. Saphenofemoral Junction: No evidence of thrombus. Normal compressibility and flow on color Doppler imaging. Profunda Femoral Vein: No evidence of thrombus. Normal compressibility and flow on color Doppler imaging. Femoral Vein: No evidence of thrombus. Normal compressibility, respiratory phasicity and response to augmentation. Popliteal Vein: There is hypoechoic expansile occlusive thrombus within the right popliteal vein (image 26 and 27), extending to the imaged portion of the right lesser saphenous vein (image 29 and 30). Calf Veins: Nonocclusive thrombus is suspected within the paired peroneal veins (images 35 and 36). Superficial Great Saphenous Vein: No evidence of thrombus. Normal compressibility and flow on color Doppler imaging. Venous Reflux:  None. Other Findings:  None. IMPRESSION: 1. Examination is positive for occlusive DVT within the right popliteal vein extending to involve the paired peroneal veins. 2. Occlusive superficial thrombophlebitis involving the right lesser saphenous vein. Electronically Signed   By: Simonne ComeJohn  Watts M.D.   On: 06/27/2016 15:57    EKG: Independently reviewed.  Assessment/Plan Principal Problem:   Pulmonary emboli (HCC)   1. Acute Pulmonary Embolism -appears to be submassive with no echo evidence of RV strain or elevation of PA pressures -I have suggested that he continue with heparin and may be sent home with coumadin after overnight observation due to the submassive nature of his PE and the clot burden -in addition he has low platelets which may be due to consumption and I would like to repeat the levels in the morning along with an INR -he needs to see me back in the office next Monday 10/23 -will need an INR checked and then we can then adjust his medications  2. DVT -will continue with anticoagulation as ordered   Code Status: Full DVT Prophylaxis:on full dose heparin  Disposition Plan: home  Time spent: 70min    I  have personally obtained a history, examined the patient, evaluated laboratory and imaging results, formulated the assessment and plan and placed orders.  The Patient requires high complexity decision making for assessment and support.    Yevonne PaxSaadat A Suhey Radford, MD [facility] Pulmonary Critical Care Medicine Sleep Medicine

## 2016-06-28 NOTE — Progress Notes (Signed)
*  PRELIMINARY RESULTS* Echocardiogram 2D Echocardiogram has been performed.  Cristela BlueHege, Keelia Graybill 06/28/2016, 10:22 AM

## 2016-06-28 NOTE — Discharge Summary (Addendum)
[facility] [place] [facility]   PATIENT NAME: Alexander BlancoRobert Worland    MR#:  [id number]  DATE OF BIRTH:  Apr 06, 1966  DATE OF ADMISSION:  06/27/2016 ADMITTING PHYSICIAN: Shaune PollackQing Chen, MD  DATE OF DISCHARGE: 06/28/2016  PRIMARY CARE PHYSICIAN: Marisue IvanLINTHAVONG, KANHKA, MD    ADMISSION DIAGNOSIS:  Acute deep vein thrombosis (DVT) of popliteal vein of right lower extremity (HCC) [I82.431] Other acute pulmonary embolism with acute cor pulmonale (HCC) [I26.09]  DISCHARGE DIAGNOSIS:  Principal Problem:   Pulmonary emboli (HCC)   SECONDARY DIAGNOSIS:   Past Medical History:  Diagnosis Date  . DVT (deep venous thrombosis) [facility](HCC)     HOSPITAL COURSE:   Pulmonary emboli and right leg DVT  Kept on heparin drip, felt better, no chest pain or SOB next day. Echo- without any strain.   D/c on oral coumadin + lovenox subcuteneous as suggested by Dr. Carolynne EdouardSadaat Khan due to massive PE.   Follow with Pulmonary clinic in 4-5 days to adjust coumadin dose.   Acute renal failure. Start normal saline IV and follow-up BMP. Need to follow with PMD.  Thrombocytopenia. Unclear etiology, follow-up CBC. Stable Hypertension. Blood pressure is up to 134/102,    Strated on amlodipine. DISCHARGE CONDITIONS:   Stable.  CONSULTS OBTAINED:  Treatment Team:  Yevonne PaxSaadat A Khan, MD  DRUG ALLERGIES:  No Known Allergies  DISCHARGE MEDICATIONS:   Current Discharge Medication List    START taking these medications   Details  amLODipine (NORVASC) 10 MG tablet Take 1 tablet (10 mg total) by mouth daily. Qty: 30 tablet, Refills: 0    enoxaparin (LOVENOX) 100 MG/ML injection Inject 1 mL (100 mg total) into the skin every 12 (twelve) hours. Qty: 10 Syringe, Refills: 0    warfarin (COUMADIN) 5 MG tablet Take 1 tablet (5 mg total) by mouth daily at 6 PM. Qty: 15 tablet, Refills: 0      CONTINUE these medications which have NOT CHANGED   Details  ibuprofen (ADVIL,MOTRIN) 800 MG tablet Take  1 tablet (800 mg total) by mouth every 8 (eight) hours as needed. Qty: 30 tablet, Refills: 0    Multiple Vitamin (MULTI-VITAMINS) TABS Take 1 tablet by mouth daily.    terbinafine (LAMISIL) 250 MG tablet Take 250 mg by mouth daily.          DISCHARGE INSTRUCTIONS:    Follow with PMD in 1-2 weeks, check kidney function.  If you experience worsening of your admission symptoms, develop shortness of breath, life threatening emergency, suicidal or homicidal thoughts you must seek medical attention immediately by calling 911 or calling your MD immediately  if symptoms less severe.  You Must read complete instructions/literature along with all the possible adverse reactions/side effects for all the Medicines you take and that have been prescribed to you. Take any new Medicines after you have completely understood and accept all the possible adverse reactions/side effects.   Please note  You were cared for by a hospitalist during your hospital stay. If you have any questions about your discharge medications or the care you received while you were in the hospital after you are discharged, you can call the unit and asked to speak with the hospitalist on call if the hospitalist that took care of you is not available. Once you are discharged, your primary care physician will handle any further medical issues. Please note that NO REFILLS for any discharge medications will be authorized once you are discharged, as it is imperative that you return  to your primary care physician (or establish a relationship with a primary care physician if you do not have one) for your aftercare needs so that they can reassess your need for medications and monitor your lab values.    Today   CHIEF COMPLAINT:   Chief Complaint  Patient presents with  . Shortness of Breath    HISTORY OF PRESENT ILLNESS:  Alexander [name]  is a 50 y.o. male present to the ED with right leg swelling and tenderness since yesterday. He  denies any fever or chills, no chest pain, palpitation, hemoptysis or shortness breath. But he said he has cough and mild shortness of breath on exertion recently. He flew to [place] last month and drives long time everyday. He is physically active. He went to PCPs office due to right leg swelling and tenderness. Venous duplex show right leg DVT. Since he also has a cough and mild shortness breath on exertion, he got CTA which show pulmonary emboli. He was treated with xarelto 1 dose. Pulmonary physician Dr. Welton Flakes suggested admitting patient and get a echocardiograph.  VITAL SIGNS:  Blood pressure (!) 139/93, pulse 63, temperature 98.4 F (36.9 C), temperature source Oral, resp. rate 16, height 5\' 10"  (1.778 m), weight 98.3 kg (216 lb 12.8 oz), SpO2 95 %.  I/O:    Intake/Output Summary (Last 24 hours) at 06/28/16 1516 Last data filed at 06/28/16 1232  Gross per 24 hour  Intake           213.13 ml  Output                0 ml  Net           213.13 ml    PHYSICAL EXAMINATION:  GENERAL:  50 y.o.-year-old patient lying in the bed with no acute distress.  EYES: Pupils equal, round, reactive to light and accommodation. No scleral icterus. Extraocular muscles intact.  HEENT: Head atraumatic, normocephalic. Oropharynx and nasopharynx clear.  NECK:  Supple, no jugular venous distention. No thyroid enlargement, no tenderness.  LUNGS: Normal breath sounds bilaterally, no wheezing, rales,rhonchi or crepitation. No use of accessory muscles of respiration.  CARDIOVASCULAR: S1, S2 normal. No murmurs, rubs, or gallops.  ABDOMEN: Soft, non-tender, non-distended. Bowel sounds present. No organomegaly or mass.  EXTREMITIES: No pedal edema, cyanosis, or clubbing.  NEUROLOGIC: Cranial nerves II through XII are intact. Muscle strength 5/5 in all extremities. Sensation intact. Gait not checked.  PSYCHIATRIC: The patient is alert and oriented x 3.  SKIN: No obvious rash, lesion, or ulcer.   DATA REVIEW:    CBC  Recent Labs Lab 06/28/16 1350  WBC 5.8  HGB 15.8  HCT 45.9  PLT 89*    Chemistries   Recent Labs Lab 06/27/16 1836  NA 143  K 3.7  CL 110  CO2 26  GLUCOSE 95  BUN 24*  CREATININE 1.40*  CALCIUM 9.2  MG 2.2    Cardiac Enzymes  Recent Labs Lab 06/27/16 1836  TROPONINI <0.03    Microbiology Results  No results found for this or any previous visit.  RADIOLOGY:  Ct Angio Chest Pe W Or Wo Contrast  Result Date: 06/27/2016 CLINICAL DATA:  Acute DVT the popliteal vein in the right lower extremity. Shortness of breath. Leg swelling. EXAM: CT ANGIOGRAPHY CHEST WITH CONTRAST TECHNIQUE: Multidetector CT imaging of the chest was performed using the standard protocol during bolus administration of intravenous contrast. Multiplanar CT image reconstructions and MIPs were obtained to evaluate the vascular anatomy.  CONTRAST:  75 mL Isovue 370 COMPARISON:  Chest x-ray 08/17/2015 FINDINGS: Cardiovascular: The heart size is normal. There is prominence of the right ventral compared to the left. Maximal right ventricular measurement is 5.2 cm. This compares with 4.4 cm maximal measurement of the left ventricle. The ratio is 1.18. There is no significant pericardial effusion. Bilateral segmental lower lobe and right middle lobe pulmonary emboli are present. There is at least 1 segmental pulmonary embolus in the upper lobes bilaterally. Mediastinum/Nodes: No significant mediastinal adenopathy is present. Lungs/Pleura: Lungs are clear. No pleural effusion or pneumothorax. Upper Abdomen: Limited imaging of the upper abdomen is unremarkable. The visualized solid organs are within normal limits. Musculoskeletal: A remote superior endplate fracture is present at T8. Remaining vertebral bodies are intact. No focal lytic or blastic lesions are present. Review of the MIP images confirms the above findings. IMPRESSION: 1. Bilateral segmental pulmonary emboli involving the lower lobes, right middle  lobe and to lesser extent upper lobes bilaterally. 2. Positive for acute PE with CT evidence of right heart strain (RV/LV Ratio = 1.18) consistent with at least submassive (intermediate risk) PE. The presence of right heart strain has been associated with an increased risk of morbidity and mortality. 3. No significant pulmonary parenchymal disease. 4. Remote superior endplate compression fracture at T8. 5. Following conversation with Dr. Hyacinth Meeker, and at his request, the patient was taken to [facility] Emergency Department. These results were called by telephone at the time of interpretation on 06/27/2016 at 5:57 pm to Dr. Bethann Punches , who verbally acknowledged these results. Electronically Signed   By: Marin Roberts M.D.   On: 06/27/2016 18:06   US Venous Img Lower Unilateral Right  Result Date: 06/27/2016 CLINICAL DATA:  Right lower extremity pain and edema for the past day. Evaluate for DVT. EXAM: RIGHT LOWER EXTREMITY VENOUS DOPPLER ULTRASOUND TECHNIQUE: Gray-scale sonography with graded compression, as well as color Doppler and duplex ultrasound were performed to evaluate the lower extremity deep venous systems from the level of the common femoral vein and including the common femoral, femoral, profunda femoral, popliteal and calf veins including the posterior tibial, peroneal and gastrocnemius veins when visible. The superficial great saphenous vein was also interrogated. Spectral Doppler was utilized to evaluate flow at rest and with distal augmentation maneuvers in the common femoral, femoral and popliteal veins. COMPARISON:  None. FINDINGS: Contralateral Common Femoral Vein: Respiratory phasicity is normal and symmetric with the symptomatic side. No evidence of thrombus. Normal compressibility. Common Femoral Vein: No evidence of thrombus. Normal compressibility, respiratory phasicity and response to augmentation. Saphenofemoral Junction: No evidence of thrombus. Normal  compressibility and flow on color Doppler imaging. Profunda Femoral Vein: No evidence of thrombus. Normal compressibility and flow on color Doppler imaging. Femoral Vein: No evidence of thrombus. Normal compressibility, respiratory phasicity and response to augmentation. Popliteal Vein: There is hypoechoic expansile occlusive thrombus within the right popliteal vein (image 26 and 27), extending to the imaged portion of the right lesser saphenous vein (image 29 and 30). Calf Veins: Nonocclusive thrombus is suspected within the paired peroneal veins (images 35 and 36). Superficial Great Saphenous Vein: No evidence of thrombus. Normal compressibility and flow on color Doppler imaging. Venous Reflux:  None. Other Findings:  None. IMPRESSION: 1. Examination is positive for occlusive DVT within the right popliteal vein extending to involve the paired peroneal veins. 2. Occlusive superficial thrombophlebitis involving the right lesser saphenous vein. Electronically Signed   By: Holland Commons.D.  On: 06/27/2016 15:57    EKG:   Orders placed or performed during the hospital encounter of 06/27/16  . ED EKG  . ED EKG      Management plans discussed with the patient, family and they are in agreement.  CODE STATUS:     Code Status Orders        Start     Ordered   06/27/16 2233  Full code  Continuous     06/27/16 2232    Code Status History    Date Active Date Inactive Code Status Order ID Comments User Context   This patient has a current code status but no historical code status.    Advance Directive Documentation   Flowsheet Row Most Recent Value  Type of Advance Directive  Healthcare Power of Attorney, Living will  Pre-existing out of facility DNR order (yellow form or pink MOST form)  No data  "MOST" Form in Place?  No data      TOTAL TIME TAKING CARE OF THIS PATIENT: 35 minutes.    Altamese Dilling M.D on 06/28/2016 at 3:16 PM  Between 7am to 6pm - Pager -  [phone]  After 6pm go to [email or web address] - password [email or web address]  [facility]  Office  [phone]  CC: Primary care physician; Marisue Ivan, MD   Note: This dictation was prepared with Dragon dictation along with smaller phrase technology. Any transcriptional errors that result from this process are unintentional.

## 2016-06-28 NOTE — Progress Notes (Signed)
Arrival Method: via stretcher with ED RN & NT Mental Orientation: A&O Telemetry: [id number], verified by Warner MccreedyJeff Cousins, NT Skin: Intact, Verified by Wynn BankerYasmin Soriano, RN IV: 2x 20g right forearm Pain: right lower leg pain, rates at a 7 out of 10, and burning sensation, ED RN gave percocet before pt came up, but pt still having pain, will call MD to try and get another pain medication on board. Tubes: heparin gtt @16 , verified by Wynn BankerYasmin Soriano, RN Safety Measures: Safety Fall Prevention Plan has been given, discussed & signed, non skid socks in place, patient has been orientated to the room, unit & staff.  Family: Has been informed of plan of care.  Orders have been reviewed & implemented. Will continue to monitor the patient. Call light has been placed within reach.  Eden LatheLexi Miller, RN

## 2016-06-28 NOTE — Progress Notes (Signed)
ANTICOAGULATION CONSULT NOTE - Initial Consult  Pharmacy Consult for Heparin  Indication: pulmonary embolus  No Known Allergies  Patient Measurements: Height: 5\' 10"  (177.8 cm) Weight: 216 lb 12.8 oz (98.3 kg) IBW/kg (Calculated) : 73 Heparin Dosing Weight: 93.3 kg   Vital Signs: Temp: 97.9 F (36.6 C) (10/19 0434) Temp Source: Oral (10/19 0434) BP: 132/84 (10/19 0434) Pulse Rate: 68 (10/19 0434)  Labs:  Recent Labs  06/27/16 1836 06/28/16 0412  HGB 15.5  --   HCT 46.5  --   PLT 99*  --   APTT 57* >160*  LABPROT 13.2  --   INR 1.00  --   HEPARINUNFRC  --  2.38*  CREATININE 1.40*  --   TROPONINI <0.03  --     Estimated Creatinine Clearance: 75 mL/min (by C-G formula based on SCr of 1.4 mg/dL (H)).   Medical History: Past Medical History:  Diagnosis Date  . DVT (deep venous thrombosis) (HCC)     Medications:  Prescriptions Prior to Admission  Medication Sig Dispense Refill Last Dose  . ibuprofen (ADVIL,MOTRIN) 800 MG tablet Take 1 tablet (800 mg total) by mouth every 8 (eight) hours as needed. 30 tablet 0 06/26/2016 at Unknown time  . Multiple Vitamin (MULTI-VITAMINS) TABS Take 1 tablet by mouth daily.   06/26/2016 at Unknown time  . terbinafine (LAMISIL) 250 MG tablet Take 250 mg by mouth daily.    06/26/2016 at Unknown time    Assessment: CrCl = 74.9 ml/min  Pt received Xarelto 15 mg PO X 1 in ED on 10/18 @ 20:00 .   Goal of Therapy:  Heparin level 0.3-0.7 units/ml aPTT 68 - 109  seconds Monitor platelets by anticoagulation protocol: Yes   Plan:  Will not bolus this pt.  Start heparin infusion at 1600  units/hr. Will draw aPTT on 10/19 @ 03:30. Will draw HL on 10/19 @ 03:30.   Will use aPTT to guide dosing until aPTT and HL converge.   10/19 AM aPTT >160, heparin level 2.38. Hold infusion x 1 hour and restart at 1400 units/hr. Next aPTT 6 hours after infusion restart.   Leray Garverick S 06/28/2016,6:42 AM

## 2016-06-28 NOTE — Progress Notes (Signed)
Phenergan 25mg  IV given for nausea, will continue to monitor.

## 2016-06-29 LAB — BASIC METABOLIC PANEL
Anion gap: 5 (ref 5–15)
BUN: 21 mg/dL — ABNORMAL HIGH (ref 6–20)
CALCIUM: 8.7 mg/dL — AB (ref 8.9–10.3)
CO2: 25 mmol/L (ref 22–32)
CREATININE: 1.15 mg/dL (ref 0.61–1.24)
Chloride: 109 mmol/L (ref 101–111)
GFR calc non Af Amer: >60 mL/min (ref >60)
Glucose, Bld: 126 mg/dL — ABNORMAL HIGH (ref 65–99)
Potassium: 3.8 mmol/L (ref 3.5–5.1)
SODIUM: 139 mmol/L (ref 135–145)

## 2016-06-29 LAB — CBC
HCT: 44.8 % (ref 40.0–52.0)
HEMOGLOBIN: 15.4 g/dL (ref 13.0–18.0)
MCH: 31.9 pg (ref 26.0–34.0)
MCHC: 34.3 g/dL (ref 32.0–36.0)
MCV: 93 fL (ref 80.0–100.0)
Platelets: 88 10*3/uL — ABNORMAL LOW (ref 150–440)
RBC: 4.82 MIL/uL (ref 4.40–5.90)
RDW: 13.8 % (ref 11.5–14.5)
WBC: 5.4 10*3/uL (ref 3.8–10.6)

## 2016-06-29 LAB — PROTIME-INR
INR: 1.13
PROTHROMBIN TIME: 14.6 s (ref 11.4–15.2)

## 2016-06-29 NOTE — Discharge Instructions (Addendum)
Deep Vein Thrombosis A deep vein thrombosis (DVT) is a blood clot (thrombus) that usually occurs in a deep, larger vein of the lower leg or the pelvis, or in an upper extremity such as the arm. These are dangerous and can lead to serious and even life-threatening complications if the clot travels to the lungs. A DVT can damage the valves in your leg veins so that instead of flowing upward, the blood pools in the lower leg. This is called post-thrombotic syndrome, and it can result in pain, swelling, discoloration, and sores on the leg. CAUSES A DVT is caused by the formation of a blood clot in your leg, pelvis, or arm. Usually, several things contribute to the formation of blood clots. A clot may develop when:  Your blood flow slows down.  Your vein becomes damaged in some way.  You have a condition that makes your blood clot more easily. RISK FACTORS A DVT is more likely to develop in:  People who are older, especially over 21 years of age.  People who are overweight (obese).  People who sit or lie still for a long time, such as during long-distance travel (over 4 hours), bed rest, hospitalization, or during recovery from certain medical conditions like a stroke.  People who do not engage in much physical activity (sedentary lifestyle).  People who have chronic breathing disorders.  People who have a personal or family history of blood clots or blood clotting disease.  People who have peripheral vascular disease (PVD), diabetes, or some types of cancer.  People who have heart disease, especially if the person had a recent heart attack or has congestive heart failure.  People who have neurological diseases that affect the legs (leg paresis).  People who have had a traumatic injury, such as breaking a hip or leg.  People who have recently had major or lengthy surgery, especially on the hip, knee, or abdomen.  People who have had a central line placed inside a large  vein.  People who take medicines that contain the hormone estrogen. These include birth control pills and hormone replacement therapy.  Pregnancy or during childbirth or the postpartum period.  Long plane flights (over 8 hours). SIGNS AND SYMPTOMS Symptoms of a DVT can include:   Swelling of your leg or arm, especially if one side is much worse.  Warmth and redness of your leg or arm, especially if one side is much worse.  Pain in your arm or leg. If the clot is in your leg, symptoms may be more noticeable or worse when you stand or walk.  A feeling of pins and needles, if the clot is in the arm. The symptoms of a DVT that has traveled to the lungs (pulmonary embolism, PE) usually start suddenly and include:  Shortness of breath while active or at rest.  Coughing or coughing up blood or blood-tinged mucus.  Chest pain that is often worse with deep breaths.  Rapid or irregular heartbeat.  Feeling light-headed or dizzy.  Fainting.  Feeling anxious.  Sweating. There may also be pain and swelling in a leg if that is where the blood clot started. These symptoms may represent a serious problem that is an emergency. Do not wait to see if the symptoms will go away. Get medical help right away. Call your local emergency services (911 in the [place] Do not drive yourself to the hospital. DIAGNOSIS Your health care provider will take a medical history and perform a physical exam. You may  also have other tests, including:  Blood tests to assess the clotting properties of your blood.  Imaging tests, such as CT, ultrasound, MRI, X-ray, and other tests to see if you have clots anywhere in your body. TREATMENT After a DVT is identified, it can be treated. The type of treatment that you receive depends on many factors, such as the cause of your DVT, your risk for bleeding or developing more clots, and other medical conditions that you have. Sometimes, a combination of treatments is  necessary. Treatment options may be combined and include:  Monitoring the blood clot with ultrasound.  Taking medicines by mouth, such as newer blood thinners (anticoagulants), thrombolytics, or warfarin.  Taking anticoagulant medicine by injection or through an IV tube.  Wearing compression stockings or using different types ofdevices.  Surgery (rare) to remove the blood clot or to place a filter in your abdomen to stop the blood clot from traveling to your lungs. Treatments for a DVT are often divided into immediate treatment and long-term treatment (up to 3 months after DVT). You can work with your health care provider to choose the treatment program that is best for you. HOME CARE INSTRUCTIONS If you are taking a newer oral anticoagulant:  Take the medicine every single day at the same time each day.  Understand what foods and drugs interact with this medicine.  Understand that there are no regular blood tests required when using this medicine.  Understand the side effects of this medicine, including excessive bruising or bleeding. Ask your health care provider or pharmacist about other possible side effects. If you are taking warfarin:  Understand how to take warfarin and know which foods can affect how warfarin works in Veterinary surgeon.  Understand that it is dangerous to take too much or too little warfarin. Too much warfarin increases the risk of bleeding. Too little warfarin continues to allow the risk for blood clots.  Follow your PT and INR blood testing schedule. The PT and INR results allow your health care provider to adjust your dose of warfarin. It is very important that you have your PT and INR tested as often as told by your health care provider.  Avoid major changes in your diet, or tell your health care provider before you change your diet. Arrange a visit with a registered dietitian to answer your questions. Many foods, especially foods that are high in vitamin K, can  interfere with warfarin and affect the PT and INR results. Eat a consistent amount of foods that are high in vitamin K, such as:  Spinach, kale, broccoli, cabbage, collard greens, turnip greens, Brussels sprouts, peas, cauliflower, seaweed, and parsley.  Beef liver and pork liver.  Green tea.  Soybean oil.  Tell your health care provider about any and all medicines, vitamins, and supplements that you take, including aspirin and other over-the-counter anti-inflammatory medicines. Be especially cautious with aspirin and anti-inflammatory medicines. Do not take those before you ask your health care provider if it is safe to do so. This is important because many medicines can interfere with warfarin and affect the PT and INR results.  Do not start or stop taking any over-the-counter or prescription medicine unless your health care provider or pharmacist tells you to do so. If you take warfarin, you will also need to do these things:  Hold pressure over cuts for longer than usual.  Tell your dentist and other health care providers that you are taking warfarin before you have any procedures in  which bleeding may occur.  Avoid alcohol or drink very small amounts. Tell your health care provider if you change your alcohol intake.  Do not use tobacco products, including cigarettes, chewing tobacco, and e-cigarettes. If you need help quitting, ask your health care provider.  Avoid contact sports. General Instructions  Take over-the-counter and prescription medicines only as told by your health care provider. Anticoagulant medicines can have side effects, including easy bruising and difficulty stopping bleeding. If you are prescribed an anticoagulant, you will also need to do these things:  Hold pressure over cuts for longer than usual.  Tell your dentist and other health care providers that you are taking anticoagulants before you have any procedures in which bleeding may occur.  Avoid contact  sports.  Wear a medical alert bracelet or carry a medical alert card that says you have had a PE.  Ask your health care provider how soon you can go back to your normal activities. Stay active to prevent new blood clots from forming.  Make sure to exercise while traveling or when you have been sitting or standing for a long period of time. It is very important to exercise. Exercise your legs by walking or by tightening and relaxing your leg muscles often. Take frequent walks.  Wear compression stockings as told by your health care provider to help prevent more blood clots from forming.  Do not use tobacco products, including cigarettes, chewing tobacco, and e-cigarettes. If you need help quitting, ask your health care provider.  Keep all follow-up appointments with your health care provider. This is important. PREVENTION Take these actions to decrease your risk of developing another DVT:  Exercise regularly. For at least 30 minutes every day, engage in:  Activity that involves moving your arms and legs.  Activity that encourages good blood flow through your body by increasing your heart rate.  Exercise your arms and legs every hour during long-distance travel (over 4 hours). Drink plenty of water and avoid drinking alcohol while traveling.  Avoid sitting or lying in bed for long periods of time without moving your legs.  Maintain a weight that is appropriate for your height. Ask your health care provider what weight is healthy for you.  If you are a woman who is over 44 years of age, avoid unnecessary use of medicines that contain estrogen. These include birth control pills.  Do not smoke, especially if you take estrogen medicines. If you need help quitting, ask your health care provider. If you are hospitalized, prevention measures may include:  Early walking after surgery, as soon as your health care provider says that it is safe.  Receiving anticoagulants to prevent blood  clots.If you cannot take anticoagulants, other options may be available, such as wearing compression stockings or using different types of devices. SEEK IMMEDIATE MEDICAL CARE IF:  You have new or increased pain, swelling, or redness in an arm or leg.  You have numbness or tingling in an arm or leg.  You have shortness of breath while active or at rest.  You have chest pain.  You have a rapid or irregular heartbeat.  You feel light-headed or dizzy.  You cough up blood.  You notice blood in your vomit, bowel movement, or urine. These symptoms may represent a serious problem that is an emergency. Do not wait to see if the symptoms will go away. Get medical help right away. Call your local emergency services (911 in the [place] Do not drive yourself to the hospital.  This information is not intended to replace advice given to you by your health care provider. Make sure you discuss any questions you have with your health care provider.   Document Released: 08/27/2005 Document Revised: 05/18/2015 Document Reviewed: 12/22/2014 Elsevier Interactive Patient Education 2016 [employer]   Pulmonary Embolism A pulmonary embolism (PE) is a sudden blockage or decrease of blood flow in one lung or both lungs. Most blockages come from a blood clot that travels from the legs or the pelvis to the lungs. PE is a dangerous and potentially life-threatening condition if it is not treated right away. CAUSES A pulmonary embolism occurs most commonly when a blood clot travels from one of your veins to your lungs. Rarely, PE is caused by air, fat, amniotic fluid, or part of a tumor traveling through your veins to your lungs. RISK FACTORS A PE is more likely to develop in:  People who smoke.  People who areolder, especially over 360 years of age.  People who are overweight (obese).  People who sit or lie still for a long time, such as during long-distance travel (over 4 hours), bed rest,  hospitalization, or during recovery from certain medical conditions like a stroke.  People who do not engage in much physical activity (sedentary lifestyle).  People who have chronic breathing disorders.  People whohave a personal or family history of blood clots or blood clotting disease.  People whohave peripheral vascular disease (PVD), diabetes, or some types of cancer.  People who haveheart disease, especially if the person had a recent heart attack or has congestive heart failure.  People who have neurological diseases that affect the legs (leg paresis).  People who have had a traumatic injury, such as breaking a hip or leg.  People whohave recently had major or lengthy surgery, especially on the hip, knee, or abdomen.  People who have hada central line placed inside a large vein.  People who takemedicines that contain the hormone estrogen. These include birth control pills and hormone replacement therapy.  Pregnancy or during childbirth or the postpartum period. SIGNS AND SYMPTOMS  The symptoms of a PE usually start suddenly and include:  Shortness of breath while active or at rest.  Coughing or coughing up blood or blood-tinged mucus.  Chest pain that is often worse with deep breaths.  Rapid or irregular heartbeat.  Feeling light-headed or dizzy.  Fainting.  Feelinganxious.  Sweating. There may also be pain and swelling in a leg if that is where the blood clot started. These symptoms may represent a serious problem that is an emergency. Do not wait to see if the symptoms will go away. Get medical help right away. Call your local emergency services (911 in the [place] Do not drive yourself to the hospital. DIAGNOSIS Your health care provider will take a medical history and perform a physical exam. You may also have other tests, including:  Blood tests to assess the clotting properties of your blood, assess oxygen levels in your blood, and find blood  clots.  Imaging tests, such as CT, ultrasound, MRI, X-ray, and other tests to see if you have clots anywhere in your body.  An electrocardiogram (ECG) to look for heart strain from blood clots in the lungs. TREATMENT The main goals of PE treatment are:  To stop a blood clot from growing larger.  To stop new blood clots from forming. The type of treatment that you receive depends on many factors, such as the cause of your PE, your risk  for bleeding or developing more clots, and other medical conditions that you have. Sometimes, a combination of treatments is necessary. This condition may be treated with:  Medicines, including newer oral blood thinners (anticoagulants), warfarin, low molecular weight heparins, thrombolytics, or heparins.  Wearing compression stockings or using different types of devices.  Surgery (rare) to remove the blood clot or to place a filter in your abdomen to stop the blood clot from traveling to your lungs. Treatments for a PE are often divided into immediate treatment, long-term treatment (up to 3 months after PE), and extended treatment (more than 3 months after PE). Your treatment may continue for several months. This is called maintenance therapy, and it is used to prevent the forming of new blood clots. You can work with your health care provider to choose the treatment program that is best for you. What are anticoagulants? Anticoagulants are medicines that treat PEs. They can stop current blood clots from growing and stop new clots from forming. They cannot dissolve existing clots. Your body dissolves clots by itself over time. Anticoagulants are given by mouth, by injection, or through an IV tube. What are thrombolytics? Thrombolytics are clot-dissolving medicines that are used to dissolve a PE. They carry a high risk of bleeding, so they tend to be used only in severe cases or if you have very low blood pressure. HOME CARE INSTRUCTIONS If you are taking a  newer oral anticoagulant:  Take the medicine every single day at the same time each day.  Understand what foods and drugs interact with this medicine.  Understand that there are no regular blood tests required when using this medicine.  Understandthe side effects of this medicine, including excessive bruising or bleeding. Ask your health care provider or pharmacist about other possible side effects. If you are taking warfarin:  Understand how to take warfarin and know which foods can affect how warfarin works in Public relations account executive.  Understand that it is dangerous to taketoo much or too little warfarin. Too much warfarin increases the risk of bleeding. Too little warfarin continues to allow the risk for blood clots.  Follow your PT and INR blood testing schedule. The PT and INR results allow your health care provider to adjust your dose of warfarin. It is very important that you have your PT and INR tested as often as told by your health care provider.  Avoid major changes in your diet, or tell your health care provider before you change your diet. Arrange a visit with a registered dietitian to answer your questions. Many foods, especially foods that are high in vitamin K, can interfere with warfarin and affect the PT and INR results. Eat a consistent amount of foods that are high in vitamin K, such as:  Spinach, kale, broccoli, cabbage, collard greens, turnip greens, Brussels sprouts, peas, cauliflower, seaweed, and parsley.  Beef liver and pork liver.  Green tea.  Soybean oil.  Tell your health care provider about any and all medicines, vitamins, and supplements that you take, including aspirin and other over-the-counter anti-inflammatory medicines. Be especially cautious with aspirin and anti-inflammatory medicines. Do not take those before you ask your health care provider if it is safe to do so. This is important because many medicines can interfere with warfarin and affect the PT and INR  results.  Do not start or stop taking any over-the-counter or prescription medicine unless your health care provider or pharmacist tells you to do so. If you take warfarin, you will also need  to do these things:  Hold pressure over cuts for longer than usual.  Tell your dentist and other health care providers that you are taking warfarin before you have any procedures in which bleeding may occur.  Avoid alcohol or drink very small amounts. Tell your health care provider if you change your alcohol intake.  Do not use tobacco products, including cigarettes, chewing tobacco, and e-cigarettes. If you need help quitting, ask your health care provider.  Avoid contact sports. General Instructions  Take over-the-counter and prescription medicines only as told by your health care provider. Anticoagulant medicines can have side effects, including easy bruising and difficulty stopping bleeding. If you are prescribed an anticoagulant, you will also need to do these things:  Hold pressure over cuts for longer than usual.  Tell your dentist and other health care providers that you are taking anticoagulants before you have any procedures in which bleeding may occur.  Avoid contact sports.  Wear a medical alert bracelet or carry a medical alert card that says you have had a PE.  Ask your health care provider how soon you can go back to your normal activities. Stay active to prevent new blood clots from forming.  Make sure to exercise while traveling or when you have been sitting or standing for a long period of time. It is very important to exercise. Exercise your legs by walking or by tightening and relaxing your leg muscles often. Take frequent walks.  Wear compression stockings as told by your health care provider to help prevent more blood clots from forming.  Do not use tobacco products, including cigarettes, chewing tobacco, and e-cigarettes. If you need help quitting, ask your health care  provider.  Keep all follow-up appointments with your health care provider. This is important. PREVENTION Take these actions to decrease your risk of developing another PE:  Exercise regularly. For at least 30 minutes every day, engage in:  Activity that involves moving your arms and legs.  Activity that encourages good blood flow through your body by increasing your heart rate.  Exercise your arms and legs every hour during long-distance travel (over 4 hours). Drink plenty of water and avoid drinking alcohol while traveling.  Avoid sitting or lying in bed for long periods of time without moving your legs.  Maintain a weight that is appropriate for your height. Ask your health care provider what weight is healthy for you.  If you are a woman who is over 39 years of age, avoid unnecessary use of medicines that contain estrogen. These include birth control pills.  Do not smoke, especially if you take estrogen medicines. If you need help quitting, ask your health care provider.  If you are at very high risk for PE, wear compression stockings.  If you recently had a PE, have regularly scheduled ultrasound testing on your legs to check for new blood clots. If you are hospitalized, prevention measures may include:  Early walking after surgery, as soon as your health care provider says that it is safe.  Receiving anticoagulants to prevent blood clots. If you cannot take anticoagulants, other options may be available, such as wearing compression stockings or using different types of devices. SEEK IMMEDIATE MEDICAL CARE IF:  You have new or increased pain, swelling, or redness in an arm or leg.  You have numbness or tingling in an arm or leg.  You have shortness of breath while active or at rest.  You have chest pain.  You have a rapid or  irregular heartbeat.  You feel light-headed or dizzy.  You cough up blood.  You notice blood in your vomit, bowel movement, or urine.  You  have a fever. These symptoms may represent a serious problem that is an emergency. Do not wait to see if the symptoms will go away. Get medical help right away. Call your local emergency services (911 in the [place] Do not drive yourself to the hospital.   This information is not intended to replace advice given to you by your health care provider. Make sure you discuss any questions you have with your health care provider.   Document Released: 08/24/2000 Document Revised: 05/18/2015 Document Reviewed: 12/22/2014 Elsevier Interactive Patient Education 2016 [employer]. Retur to the ER if you develop chest pain or shortness of breath.   Pain control: Take tylenol 1000mg  every 8 hours. Take 5mg  of oxycodone every 6 hours for breakthrough pain. Do not drink alcohol, drive or participate in any other potentially dangerous activities while taking this medication as it may make you sleepy. Do not take this medication with any other sedating medications, either prescription or over-the-counter.  Information on my medicine - Coumadin   (Warfarin)  This medication education was reviewed with me or my healthcare representative as part of my discharge preparation.  The pharmacist that spoke with me during my hospital stay was:  Olene Floss, [facility]  Why was Coumadin prescribed for you? Coumadin was prescribed for you because you have a blood clot or a medical condition that can cause an increased risk of forming blood clots. Blood clots can cause serious health problems by blocking the flow of blood to the heart, lung, or brain. Coumadin can prevent harmful blood clots from forming. As a reminder your indication for Coumadin is:   Pulmonary Embolism Treatment  What test will check on my response to Coumadin? While on Coumadin (warfarin) you will need to have an INR test regularly to ensure that your dose is keeping you in the desired range. The INR (international normalized ratio) number is calculated  from the result of the laboratory test called prothrombin time (PT).  If an INR APPOINTMENT HAS NOT ALREADY BEEN MADE FOR YOU please schedule an appointment to have this lab work done by your health care provider within 7 days. Your INR goal is usually a number between:  2 to 3 or your provider may give you a more narrow range like 2-2.5.  Ask your health care provider during an office visit what your goal INR is.  What  do you need to  know  About  COUMADIN? Take Coumadin (warfarin) exactly as prescribed by your healthcare provider about the same time each day.  DO NOT stop taking without talking to the doctor who prescribed the medication.  Stopping without other blood clot prevention medication to take the place of Coumadin may increase your risk of developing a new clot or stroke.  Get refills before you run out.  What do you do if you miss a dose? If you miss a dose, take it as soon as you remember on the same day then continue your regularly scheduled regimen the next day.  Do not take two doses of Coumadin at the same time.  Important Safety Information A possible side effect of Coumadin (Warfarin) is an increased risk of bleeding. You should call your healthcare provider right away if you experience any of the following: ? Bleeding from an injury or your nose that does not stop. ?  Unusual colored urine (red or dark brown) or unusual colored stools (red or black). ? Unusual bruising for unknown reasons. ? A serious fall or if you hit your head (even if there is no bleeding).  Some foods or medicines interact with Coumadin (warfarin) and might alter your response to warfarin. To help avoid this: ? Eat a balanced diet, maintaining a consistent amount of Vitamin K. ? Notify your provider about major diet changes you plan to make. ? Avoid alcohol or limit your intake to 1 drink for women and 2 drinks for men per day. (1 drink is 5 oz. wine, 12 oz. beer, or 1.5 oz. liquor.)  Make sure that  ANY health care provider who prescribes medication for you knows that you are taking Coumadin (warfarin).  Also make sure the healthcare provider who is monitoring your Coumadin knows when you have started a new medication including herbals and non-prescription products.  Coumadin (Warfarin)  Major Drug Interactions  Increased Warfarin Effect Decreased Warfarin Effect  Alcohol (large quantities) Antibiotics (esp. Septra/Bactrim, Flagyl, Cipro) Amiodarone (Cordarone) Aspirin (ASA) Cimetidine (Tagamet) Megestrol (Megace) NSAIDs (ibuprofen, naproxen, etc.) Piroxicam (Feldene) Propafenone (Rythmol SR) Propranolol (Inderal) Isoniazid (INH) Posaconazole (Noxafil) Barbiturates (Phenobarbital) Carbamazepine (Tegretol) Chlordiazepoxide (Librium) Cholestyramine (Questran) Griseofulvin Oral Contraceptives Rifampin Sucralfate (Carafate) Vitamin K   Coumadin (Warfarin) Major Herbal Interactions  Increased Warfarin Effect Decreased Warfarin Effect  Garlic Ginseng Ginkgo biloba Coenzyme Q10 Green tea St. Johns wort    Coumadin (Warfarin) FOOD Interactions  Eat a consistent number of servings per week of foods HIGH in Vitamin K (1 serving =  cup)  Collards (cooked, or boiled & drained) Kale (cooked, or boiled & drained) Mustard greens (cooked, or boiled & drained) Parsley *serving size only =  cup Spinach (cooked, or boiled & drained) Swiss chard (cooked, or boiled & drained) Turnip greens (cooked, or boiled & drained)  Eat a consistent number of servings per week of foods MEDIUM-HIGH in Vitamin K (1 serving = 1 cup)  Asparagus (cooked, or boiled & drained) Broccoli (cooked, boiled & drained, or raw & chopped) Brussel sprouts (cooked, or boiled & drained) *serving size only =  cup Lettuce, raw (green leaf, endive, romaine) Spinach, raw Turnip greens, raw & chopped   These websites have more information on Coumadin (warfarin):   http://www.king-russell.com/; https://www.hines.net/;

## 2016-06-29 NOTE — Discharge Summary (Signed)
[facility] - [place] at [facility]   PATIENT NAME: Alexander [name]    MR#:  [id number]  DATE OF BIRTH:  1965/10/29  DATE OF ADMISSION:  06/27/2016 ADMITTING PHYSICIAN: Shaune Pollack, MD  DATE OF DISCHARGE: 06/29/2016  PRIMARY CARE PHYSICIAN: Marisue Ivan, MD    ADMISSION DIAGNOSIS:  Acute deep vein thrombosis (DVT) of popliteal vein of right lower extremity (HCC) [I82.431] Other acute pulmonary embolism with acute cor pulmonale (HCC) [I26.09]  DISCHARGE DIAGNOSIS:  Principal Problem:   Pulmonary emboli (HCC)   DVT  SECONDARY DIAGNOSIS:   Past Medical History:  Diagnosis Date  . DVT (deep venous thrombosis) [facility])     HOSPITAL COURSE:   Pulmonary emboli and right leg DVT  Kept on heparin drip, felt better, no chest pain or SOB next day. Echo- without any strain.   D/c on oral coumadin + lovenox subcuteneous as suggested by Dr. Carolynne Edouard due to massive PE.   Follow with Pulmonary clinic in 4-5 days to adjust coumadin dose.    Also have thrombocytopenia, so monitored platelets one more day, remained stable.  Acute renal failure. Start normal saline IV and follow-up BMP. Need to follow with PMD.  Thrombocytopenia. Unclear etiology, follow-up CBC. Stable Hypertension. Blood pressure is up to 134/102,    Strated on amlodipine. DISCHARGE CONDITIONS:   Stable.  CONSULTS OBTAINED:  Treatment Team:  Yevonne Pax, MD  DRUG ALLERGIES:  No Known Allergies  DISCHARGE MEDICATIONS:   Current Discharge Medication List    START taking these medications   Details  amLODipine (NORVASC) 10 MG tablet Take 1 tablet (10 mg total) by mouth daily. Qty: 30 tablet, Refills: 0    enoxaparin (LOVENOX) 100 MG/ML injection Inject 1 mL (100 mg total) into the skin every 12 (twelve) hours. Qty: 10 Syringe, Refills: 0    warfarin (COUMADIN) 5 MG tablet Take 1 tablet (5 mg total) by mouth daily at 6 PM. Qty: 15 tablet, Refills: 0      CONTINUE these  medications which have NOT CHANGED   Details  ibuprofen (ADVIL,MOTRIN) 800 MG tablet Take 1 tablet (800 mg total) by mouth every 8 (eight) hours as needed. Qty: 30 tablet, Refills: 0    Multiple Vitamin (MULTI-VITAMINS) TABS Take 1 tablet by mouth daily.    terbinafine (LAMISIL) 250 MG tablet Take 250 mg by mouth daily.          DISCHARGE INSTRUCTIONS:    Follow with PMD in 1-2 weeks, check kidney function.  If you experience worsening of your admission symptoms, develop shortness of breath, life threatening emergency, suicidal or homicidal thoughts you must seek medical attention immediately by calling 911 or calling your MD immediately  if symptoms less severe.  You Must read complete instructions/literature along with all the possible adverse reactions/side effects for all the Medicines you take and that have been prescribed to you. Take any new Medicines after you have completely understood and accept all the possible adverse reactions/side effects.   Please note  You were cared for by a hospitalist during your hospital stay. If you have any questions about your discharge medications or the care you received while you were in the hospital after you are discharged, you can call the unit and asked to speak with the hospitalist on call if the hospitalist that took care of you is not available. Once you are discharged, your primary care physician will handle any further medical issues. Please note that NO REFILLS for any discharge  medications will be authorized once you are discharged, as it is imperative that you return to your primary care physician (or establish a relationship with a primary care physician if you do not have one) for your aftercare needs so that they can reassess your need for medications and monitor your lab values.    Today   CHIEF COMPLAINT:   Chief Complaint  Patient presents with  . Shortness of Breath    HISTORY OF PRESENT ILLNESS:  Alexander [name]  is a  50 y.o. male present to the ED with right leg swelling and tenderness since yesterday. He denies any fever or chills, no chest pain, palpitation, hemoptysis or shortness breath. But he said he has cough and mild shortness of breath on exertion recently. [place] to [place] last month and drives long time everyday. He is physically active. He went to PCPs office due to right leg swelling and tenderness. Venous duplex show right leg DVT. Since he also has a cough and mild shortness breath on exertion, he got CTA which show pulmonary emboli. He was treated with xarelto 1 dose. Pulmonary physician Dr. Welton FlakesKhan suggested admitting patient and get a echocardiograph.  VITAL SIGNS:  Blood pressure 128/86, pulse 74, temperature 98.2 F (36.8 C), temperature source Oral, resp. rate 20, height 5\' 10"  (1.778 m), weight 98.3 kg (216 lb 12.8 oz), SpO2 96 %.  I/O:    Intake/Output Summary (Last 24 hours) at 06/29/16 1250 Last data filed at 06/29/16 1037  Gross per 24 hour  Intake            576.6 ml  Output              325 ml  Net            251.6 ml    PHYSICAL EXAMINATION:  GENERAL:  50 y.o.-year-old patient lying in the bed with no acute distress.  EYES: Pupils equal, round, reactive to light and accommodation. No scleral icterus. Extraocular muscles intact.  HEENT: Head atraumatic, normocephalic. Oropharynx and nasopharynx clear.  NECK:  Supple, no jugular venous distention. No thyroid enlargement, no tenderness.  LUNGS: Normal breath sounds bilaterally, no wheezing, rales,rhonchi or crepitation. No use of accessory muscles of respiration.  CARDIOVASCULAR: S1, S2 normal. No murmurs, rubs, or gallops.  ABDOMEN: Soft, non-tender, non-distended. Bowel sounds present. No organomegaly or mass.  EXTREMITIES: No pedal edema, cyanosis, or clubbing.  NEUROLOGIC: Cranial nerves II through XII are intact. Muscle strength 5/5 in all extremities. Sensation intact. Gait not checked.  PSYCHIATRIC: The patient is alert  and oriented x 3.  SKIN: No obvious rash, lesion, or ulcer.   DATA REVIEW:   CBC  Recent Labs Lab 06/29/16 0429  WBC 5.4  HGB 15.4  HCT 44.8  PLT 88*    Chemistries   Recent Labs Lab 06/27/16 1836 06/29/16 0429  NA 143 139  K 3.7 3.8  CL 110 109  CO2 26 25  GLUCOSE 95 126*  BUN 24* 21*  CREATININE 1.40* 1.15  CALCIUM 9.2 8.7*  MG 2.2  --     Cardiac Enzymes  Recent Labs Lab 06/27/16 1836  TROPONINI <0.03    Microbiology Results  No results found for this or any previous visit.  RADIOLOGY:  Ct Angio Chest Pe W Or Wo Contrast  Result Date: 06/27/2016 CLINICAL DATA:  Acute DVT the popliteal vein in the right lower extremity. Shortness of breath. Leg swelling. EXAM: CT ANGIOGRAPHY CHEST WITH CONTRAST TECHNIQUE: Multidetector CT imaging of  the chest was performed using the standard protocol during bolus administration of intravenous contrast. Multiplanar CT image reconstructions and MIPs were obtained to evaluate the vascular anatomy. CONTRAST:  75 mL Isovue 370 COMPARISON:  Chest x-ray 08/17/2015 FINDINGS: Cardiovascular: The heart size is normal. There is prominence of the right ventral compared to the left. Maximal right ventricular measurement is 5.2 cm. This compares with 4.4 cm maximal measurement of the left ventricle. The ratio is 1.18. There is no significant pericardial effusion. Bilateral segmental lower lobe and right middle lobe pulmonary emboli are present. There is at least 1 segmental pulmonary embolus in the upper lobes bilaterally. Mediastinum/Nodes: No significant mediastinal adenopathy is present. Lungs/Pleura: Lungs are clear. No pleural effusion or pneumothorax. Upper Abdomen: Limited imaging of the upper abdomen is unremarkable. The visualized solid organs are within normal limits. Musculoskeletal: A remote superior endplate fracture is present at T8. Remaining vertebral bodies are intact. No focal lytic or blastic lesions are present. Review of the  MIP images confirms the above findings. IMPRESSION: 1. Bilateral segmental pulmonary emboli involving the lower lobes, right middle lobe and to lesser extent upper lobes bilaterally. 2. Positive for acute PE with CT evidence of right heart strain (RV/LV Ratio = 1.18) consistent with at least submassive (intermediate risk) PE. The presence of right heart strain has been associated with an increased risk of morbidity and mortality. 3. No significant pulmonary parenchymal disease. 4. Remote superior endplate compression fracture at T8. 5. Following conversation with Dr. Hyacinth Meeker, and at his request, the patient was taken to the [facility] Emergency Department. These results were called by telephone at the time of interpretation on 06/27/2016 at 5:57 pm to Dr. Bethann Punches , who verbally acknowledged these results. Electronically Signed   By: Marin Roberts M.D.   On: 06/27/2016 18:06   US Venous Img Lower Unilateral Right  Result Date: 06/27/2016 CLINICAL DATA:  Right lower extremity pain and edema for the past day. Evaluate for DVT. EXAM: RIGHT LOWER EXTREMITY VENOUS DOPPLER ULTRASOUND TECHNIQUE: Gray-scale sonography with graded compression, as well as color Doppler and duplex ultrasound were performed to evaluate the lower extremity deep venous systems from the level of the common femoral vein and including the common femoral, femoral, profunda femoral, popliteal and calf veins including the posterior tibial, peroneal and gastrocnemius veins when visible. The superficial great saphenous vein was also interrogated. Spectral Doppler was utilized to evaluate flow at rest and with distal augmentation maneuvers in the common femoral, femoral and popliteal veins. COMPARISON:  None. FINDINGS: Contralateral Common Femoral Vein: Respiratory phasicity is normal and symmetric with the symptomatic side. No evidence of thrombus. Normal compressibility. Common Femoral Vein: No evidence of thrombus.  Normal compressibility, respiratory phasicity and response to augmentation. Saphenofemoral Junction: No evidence of thrombus. Normal compressibility and flow on color Doppler imaging. Profunda Femoral Vein: No evidence of thrombus. Normal compressibility and flow on color Doppler imaging. Femoral Vein: No evidence of thrombus. Normal compressibility, respiratory phasicity and response to augmentation. Popliteal Vein: There is hypoechoic expansile occlusive thrombus within the right popliteal vein (image 26 and 27), extending to the imaged portion of the right lesser saphenous vein (image 29 and 30). Calf Veins: Nonocclusive thrombus is suspected within the paired peroneal veins (images 35 and 36). Superficial Great Saphenous Vein: No evidence of thrombus. Normal compressibility and flow on color Doppler imaging. Venous Reflux:  None. Other Findings:  None. IMPRESSION: 1. Examination is positive for occlusive DVT within the right popliteal vein extending  to involve the paired peroneal veins. 2. Occlusive superficial thrombophlebitis involving the right lesser saphenous vein. Electronically Signed   By: Simonne Come M.D.   On: 06/27/2016 15:57    EKG:   Orders placed or performed during the hospital encounter of 06/27/16  . ED EKG  . ED EKG      Management plans discussed with the patient, family and they are in agreement.  CODE STATUS:     Code Status Orders        Start     Ordered   06/27/16 2233  Full code  Continuous     06/27/16 2232    Code Status History    Date Active Date Inactive Code Status Order ID Comments User Context   This patient has a current code status but no historical code status.    Advance Directive Documentation   Flowsheet Row Most Recent Value  Type of Advance Directive  Healthcare Power of Attorney, Living will  Pre-existing out of facility DNR order (yellow form or pink MOST form)  No data  "MOST" Form in Place?  No data      TOTAL TIME TAKING CARE OF  THIS PATIENT: 35 minutes.    Altamese Dilling M.D on 06/29/2016 at 12:50 PM  Between 7am to 6pm - Pager - [phone]  After 6pm go to [email or web address] - password [email or web address]  [facility]  Office  [phone]  CC: Primary care physician; Marisue Ivan, MD   Note: This dictation was prepared with Dragon dictation along with smaller phrase technology. Any transcriptional errors that result from this process are unintentional.

## 2016-06-29 NOTE — Progress Notes (Signed)
Patient discharged via wheelchair and private vehicle. IV removed and catheter intact. All discharge instructions given and patient verbalizes understanding. Tele removed and returned. Prescriptions given to patient No distress noted.   

## 2016-06-29 NOTE — Progress Notes (Signed)
Patient was educated and able to administer Lovenox injection this am.

## 2016-07-06 ENCOUNTER — Other Ambulatory Visit: Payer: Self-pay | Admitting: Physician Assistant

## 2016-07-06 ENCOUNTER — Ambulatory Visit
Admission: RE | Admit: 2016-07-06 | Discharge: 2016-07-06 | Disposition: A | Discharge status: Home / Self Care | Payer: 59 | Source: Ambulatory Visit | Attending: Physician Assistant | Admitting: Physician Assistant

## 2016-07-06 DIAGNOSIS — R059 Cough, unspecified: Secondary | ICD-10-CM

## 2016-07-06 DIAGNOSIS — R05 Cough: Secondary | ICD-10-CM

## 2016-07-19 ENCOUNTER — Other Ambulatory Visit: Payer: Self-pay | Admitting: Internal Medicine

## 2016-07-19 DIAGNOSIS — I2699 Other pulmonary embolism without acute cor pulmonale: Secondary | ICD-10-CM

## 2016-07-23 ENCOUNTER — Ambulatory Visit
Admission: RE | Admit: 2016-07-23 | Discharge: 2016-07-23 | Disposition: A | Discharge status: Home / Self Care | Payer: 59 | Source: Ambulatory Visit | Attending: Internal Medicine | Admitting: Internal Medicine

## 2016-07-23 DIAGNOSIS — I2699 Other pulmonary embolism without acute cor pulmonale: Secondary | ICD-10-CM | POA: Insufficient documentation

## 2016-07-23 MED ORDER — IOPAMIDOL (ISOVUE-370) INJECTION 76%
100.0000 mL | Freq: Once | INTRAVENOUS | Status: AC | PRN
  Administered 2016-07-23: 100 mL via INTRAVENOUS

## 2016-09-04 ENCOUNTER — Other Ambulatory Visit: Payer: Self-pay | Admitting: Internal Medicine

## 2016-09-04 DIAGNOSIS — I8290 Acute embolism and thrombosis of unspecified vein: Secondary | ICD-10-CM

## 2016-09-06 ENCOUNTER — Ambulatory Visit
Admission: RE | Admit: 2016-09-06 | Discharge: 2016-09-06 | Disposition: A | Discharge status: Home / Self Care | Payer: 59 | Source: Ambulatory Visit | Attending: Internal Medicine | Admitting: Internal Medicine

## 2016-09-06 DIAGNOSIS — I82409 Acute embolism and thrombosis of unspecified deep veins of unspecified lower extremity: Secondary | ICD-10-CM | POA: Diagnosis present

## 2016-09-06 DIAGNOSIS — I8290 Acute embolism and thrombosis of unspecified vein: Secondary | ICD-10-CM

## 2016-09-06 DIAGNOSIS — I82531 Chronic embolism and thrombosis of right popliteal vein: Secondary | ICD-10-CM | POA: Diagnosis not present

## 2016-09-13 DIAGNOSIS — Z0000 Encounter for general adult medical examination without abnormal findings: Secondary | ICD-10-CM | POA: Diagnosis not present

## 2016-09-19 DIAGNOSIS — Z86718 Personal history of other venous thrombosis and embolism: Secondary | ICD-10-CM | POA: Diagnosis not present

## 2016-09-19 DIAGNOSIS — E7800 Pure hypercholesterolemia, unspecified: Secondary | ICD-10-CM | POA: Diagnosis not present

## 2016-09-19 DIAGNOSIS — Z86711 Personal history of pulmonary embolism: Secondary | ICD-10-CM | POA: Diagnosis not present

## 2016-10-03 DIAGNOSIS — Z7901 Long term (current) use of anticoagulants: Secondary | ICD-10-CM | POA: Diagnosis not present

## 2016-10-03 DIAGNOSIS — I8310 Varicose veins of unspecified lower extremity with inflammation: Secondary | ICD-10-CM | POA: Diagnosis not present

## 2016-10-03 DIAGNOSIS — I2699 Other pulmonary embolism without acute cor pulmonale: Secondary | ICD-10-CM | POA: Diagnosis not present

## 2016-10-09 ENCOUNTER — Encounter (INDEPENDENT_AMBULATORY_CARE_PROVIDER_SITE_OTHER): Payer: Self-pay | Admitting: Vascular Surgery

## 2016-10-09 ENCOUNTER — Ambulatory Visit (INDEPENDENT_AMBULATORY_CARE_PROVIDER_SITE_OTHER): Payer: 59 | Admitting: Vascular Surgery

## 2016-10-09 VITALS — BP 135/88 | HR 92 | Resp 18 | Ht 70.0 in | Wt 224.6 lb

## 2016-10-09 DIAGNOSIS — I824Z2 Acute embolism and thrombosis of unspecified deep veins of left distal lower extremity: Secondary | ICD-10-CM | POA: Insufficient documentation

## 2016-10-09 DIAGNOSIS — I2782 Chronic pulmonary embolism: Secondary | ICD-10-CM | POA: Diagnosis not present

## 2016-10-09 DIAGNOSIS — I82531 Chronic embolism and thrombosis of right popliteal vein: Secondary | ICD-10-CM | POA: Diagnosis not present

## 2016-10-09 NOTE — Assessment & Plan Note (Addendum)
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 He has been on anticoagulation for about 3-1/2 months now and has tolerated this well. He had an associated pulmonary embolus and he should require at least 6 months of anticoagulation. His postphlebitic symptoms are mild. There is very minimal risk of embolization from this chronic DVT particularly while on anticoagulation. I think he should resume normal activities without limitation. I will see him back on an as-needed basis. There is no role for surgery or interventional therapy at this point. He should continue wearing compression stockings regularly.

## 2016-10-09 NOTE — Progress Notes (Signed)
Patient ID: Alexander Mcobert W Son Jr., male   DOB: 1965-10-11, 51 y.o.   MRN: [id number]  Chief Complaint  Patient presents with  . New Patient (Initial Visit)    HPI Alexander McRobert W Dileonardo Jr. is a 51 y.o. male.  I am asked to see the patient by Dr. Zada GirtSh. Khan for evaluation of chronic DVT of the right leg.  The patient reports About 3-1/2 months ago he developed a right lower extremity DVT and pulmonary embolus after a long flight to [place]. He had no previous history of DVT or pulmonary embolus. He reports no trauma, injury, or surgery. Initially, he had a fair bit of right leg pain and swelling but this has resolved to be very mild at this point. He does still have some occasional right lower extremity discomfort and swelling. He really no longer has any chest pain or shortness of breath. He has tolerated anticoagulation well for 3-1/2 months now. He is interested in resuming normal activities.   Past Medical History:  Diagnosis Date  . DVT (deep venous thrombosis) (HCC)     Past Surgical History:  Procedure Laterality Date  . NASAL FRACTURE SURGERY    . VASECTOMY      Family History  Problem Relation Age of Onset  . Heart attack Father   . Stroke Father   No bleeding or clotting disorders  Social History Social History  Substance Use Topics  . Smoking status: Never Smoker  . Smokeless tobacco: Never Used  . Alcohol use No     Allergies  Allergen Reactions  . Oxycodone-Acetaminophen Nausea Only  . Tramadol Other (See Comments)    Causes depression    Current Outpatient Prescriptions  Medication Sig Dispense Refill  . warfarin (COUMADIN) 5 MG tablet Take 1 tablet (5 mg total) by mouth daily at 6 PM. 15 tablet 0  . amLODipine (NORVASC) 10 MG tablet Take 1 tablet (10 mg total) by mouth daily. (Patient not taking: Reported on 10/09/2016) 30 tablet 0  . enoxaparin (LOVENOX) 100 MG/ML injection Inject 1 mL (100 mg total) into the skin every 12 (twelve) hours. 10 Syringe 0  .  ibuprofen (ADVIL,MOTRIN) 800 MG tablet Take 1 tablet (800 mg total) by mouth every 8 (eight) hours as needed. (Patient not taking: Reported on 10/09/2016) 30 tablet 0  . Multiple Vitamin (MULTI-VITAMINS) TABS Take 1 tablet by mouth daily.    Marland Kitchen. terbinafine (LAMISIL) 250 MG tablet Take 250 mg by mouth daily.      No current facility-administered medications for this visit.       REVIEW OF SYSTEMS (Negative unless checked)  Constitutional: [] Weight loss  [] Fever  [] Chills Cardiac: [] Chest pain   [] Chest pressure   [] Palpitations   [] Shortness of breath when laying flat   [] Shortness of breath at rest   [] Shortness of breath with exertion. Vascular:  [] Pain in legs with walking   [] Pain in legs at rest   [] Pain in legs when laying flat   [] Claudication   [] Pain in feet when walking  [] Pain in feet at rest  [] Pain in feet when laying flat   [x] History of DVT   [] Phlebitis   [x] Swelling in legs   [] Varicose veins   [] Non-healing ulcers Pulmonary:   [] Uses home oxygen   [] Productive cough   [] Hemoptysis   [] Wheeze  [] COPD   [] Asthma Neurologic:  [] Dizziness  [] Blackouts   [] Seizures   [] History of stroke   [] History of TIA  [] Aphasia   [] Temporary blindness   []   Dysphagia   [] Weakness or numbness in arms   [] Weakness or numbness in legs Musculoskeletal:  [] Arthritis   [] Joint swelling   [] Joint pain   [] Low back pain Hematologic:  [] Easy bruising  [] Easy bleeding   [] Hypercoagulable state   [] Anemic  [] Hepatitis Gastrointestinal:  [] Blood in stool   [] Vomiting blood  [] Gastroesophageal reflux/heartburn   [] Abdominal pain Genitourinary:  [] Chronic kidney disease   [] Difficult urination  [] Frequent urination  [] Burning with urination   [] Hematuria Skin:  [] Rashes   [] Ulcers   [] Wounds Psychological:  [] History of anxiety   []  History of major depression.    Physical Exam BP 135/88   Pulse 92   Resp 18   Ht 5\' 10"  (1.778 m)   Wt 224 lb 9.6 oz (101.9 kg)   BMI 32.23 kg/m  Gen:  WD/WN, NAD.  incredibly fit-appearing Head: [place]/AT, No temporalis wasting. Prominent temp pulse not noted. Ear/Nose/Throat: Hearing grossly intact, nares w/o erythema or drainage, oropharynx w/o Erythema/Exudate Eyes: Conjunctiva clear, sclera non-icteric  Neck: trachea midline.  No JVD.  Pulmonary:  Good air movement, respirations not labored.  Cardiac: RRR, normal S1, S2 Vascular:  Vessel Right Left  Radial Palpable Palpable  Ulnar Palpable Palpable  Brachial Palpable Palpable  Carotid Palpable, without bruit Palpable, without bruit  Aorta Not palpable N/A  Femoral Palpable Palpable  Popliteal Palpable Palpable  PT Palpable Palpable  DP Palpable Palpable   Gastrointestinal: soft, non-tender/non-distended. No guarding/reflex. No masses, surgical incisions, or scars. Musculoskeletal: M/S 5/5 throughout.  Extremities without ischemic changes.  No deformity or atrophy. Trace right lower extremity edema, no left lower extremity edema. Neurologic: Sensation grossly intact in extremities.  Symmetrical.  Speech is fluent. Motor exam as listed above. Psychiatric: Judgment intact, Mood & affect appropriate for pt's clinical situation. Dermatologic: No rashes or ulcers noted.  No cellulitis or open wounds. Lymph : No Cervical, Axillary, or Inguinal lymphadenopathy.   Radiology No results found.  Labs No results found for this or any previous visit (from the past 2160 hour(s)).  Assessment/Plan:  Pulmonary emboli (HCC) Has been on anticoagulation and tolerating this well for about 3-1/2 months now.  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 He has been on anticoagulation for about 3-1/2 months now and has tolerated this well. He had an associated pulmonary embolus and he should require at least 6 months of anticoagulation. His postphlebitic symptoms are mild. There is very minimal risk of embolization from this chronic DVT particularly while on  anticoagulation. I think he should resume normal activities without limitation. I will see him back on an as-needed basis. There is no role for surgery or interventional therapy at this point. He should continue wearing compression stockings regularly.      Festus Barren 10/09/2016, 4:08 PM   This note was created with Dragon medical transcription system.  Any errors from dictation are unintentional.

## 2016-10-09 NOTE — Assessment & Plan Note (Signed)
Has been on anticoagulation and tolerating this well for about 3-1/2 months now.

## 2017-01-03 DIAGNOSIS — Z7901 Long term (current) use of anticoagulants: Secondary | ICD-10-CM | POA: Diagnosis not present

## 2017-01-03 DIAGNOSIS — I8290 Acute embolism and thrombosis of unspecified vein: Secondary | ICD-10-CM | POA: Diagnosis not present

## 2017-03-21 DIAGNOSIS — E7800 Pure hypercholesterolemia, unspecified: Secondary | ICD-10-CM | POA: Diagnosis not present

## 2017-03-25 DIAGNOSIS — Z86711 Personal history of pulmonary embolism: Secondary | ICD-10-CM | POA: Diagnosis not present

## 2017-03-25 DIAGNOSIS — E7800 Pure hypercholesterolemia, unspecified: Secondary | ICD-10-CM | POA: Diagnosis not present

## 2017-03-25 DIAGNOSIS — Z0000 Encounter for general adult medical examination without abnormal findings: Secondary | ICD-10-CM | POA: Diagnosis not present

## 2017-03-26 ENCOUNTER — Other Ambulatory Visit: Payer: Self-pay | Admitting: Family Medicine

## 2017-03-26 DIAGNOSIS — Z86711 Personal history of pulmonary embolism: Secondary | ICD-10-CM

## 2017-04-15 ENCOUNTER — Ambulatory Visit
Admission: RE | Admit: 2017-04-15 | Discharge: 2017-04-15 | Disposition: A | Discharge status: Home / Self Care | Payer: 59 | Source: Ambulatory Visit | Attending: Family Medicine | Admitting: Family Medicine

## 2017-04-15 ENCOUNTER — Other Ambulatory Visit: Payer: Self-pay | Admitting: Family Medicine

## 2017-04-15 DIAGNOSIS — I2510 Atherosclerotic heart disease of native coronary artery without angina pectoris: Secondary | ICD-10-CM | POA: Diagnosis not present

## 2017-04-15 DIAGNOSIS — Z86711 Personal history of pulmonary embolism: Secondary | ICD-10-CM

## 2017-04-15 DIAGNOSIS — I2699 Other pulmonary embolism without acute cor pulmonale: Secondary | ICD-10-CM | POA: Diagnosis not present

## 2017-04-15 DIAGNOSIS — J9811 Atelectasis: Secondary | ICD-10-CM | POA: Diagnosis not present

## 2017-04-15 DIAGNOSIS — R0602 Shortness of breath: Secondary | ICD-10-CM | POA: Diagnosis present

## 2017-04-15 MED ORDER — IOPAMIDOL (ISOVUE-370) INJECTION 76%
75.0000 mL | Freq: Once | INTRAVENOUS | Status: AC | PRN
Start: 2017-04-15 — End: 2017-04-15
  Administered 2017-04-15: 75 mL via INTRAVENOUS

## 2017-05-03 DIAGNOSIS — Z1211 Encounter for screening for malignant neoplasm of colon: Secondary | ICD-10-CM | POA: Diagnosis not present

## 2017-05-09 ENCOUNTER — Encounter: Payer: Self-pay | Admitting: Emergency Medicine

## 2017-05-09 ENCOUNTER — Emergency Department
Admission: EM | Admit: 2017-05-09 | Discharge: 2017-05-09 | Disposition: A | Discharge status: Home / Self Care | Payer: 59 | Attending: Emergency Medicine | Admitting: Emergency Medicine

## 2017-05-09 ENCOUNTER — Emergency Department: Payer: 59

## 2017-05-09 DIAGNOSIS — I82402 Acute embolism and thrombosis of unspecified deep veins of left lower extremity: Secondary | ICD-10-CM | POA: Diagnosis not present

## 2017-05-09 DIAGNOSIS — I82492 Acute embolism and thrombosis of other specified deep vein of left lower extremity: Secondary | ICD-10-CM | POA: Diagnosis not present

## 2017-05-09 DIAGNOSIS — Z7901 Long term (current) use of anticoagulants: Secondary | ICD-10-CM | POA: Diagnosis not present

## 2017-05-09 DIAGNOSIS — R2242 Localized swelling, mass and lump, left lower limb: Secondary | ICD-10-CM | POA: Diagnosis present

## 2017-05-09 DIAGNOSIS — I82432 Acute embolism and thrombosis of left popliteal vein: Secondary | ICD-10-CM | POA: Diagnosis not present

## 2017-05-09 DIAGNOSIS — I82409 Acute embolism and thrombosis of unspecified deep veins of unspecified lower extremity: Secondary | ICD-10-CM

## 2017-05-09 DIAGNOSIS — Z86711 Personal history of pulmonary embolism: Secondary | ICD-10-CM | POA: Insufficient documentation

## 2017-05-09 LAB — COMPREHENSIVE METABOLIC PANEL
ALT: 26 U/L (ref 17–63)
AST: 30 U/L (ref 15–41)
Albumin: 4.5 g/dL (ref 3.5–5.0)
Alkaline Phosphatase: 56 U/L (ref 38–126)
Anion gap: 8 (ref 5–15)
BUN: 26 mg/dL — ABNORMAL HIGH (ref 6–20)
CALCIUM: 9.3 mg/dL (ref 8.9–10.3)
CO2: 24 mmol/L (ref 22–32)
CREATININE: 1.13 mg/dL (ref 0.61–1.24)
Chloride: 108 mmol/L (ref 101–111)
GLUCOSE: 97 mg/dL (ref 65–99)
Potassium: 4 mmol/L (ref 3.5–5.1)
SODIUM: 140 mmol/L (ref 135–145)
TOTAL PROTEIN: 7.4 g/dL (ref 6.5–8.1)
Total Bilirubin: 1 mg/dL (ref 0.3–1.2)

## 2017-05-09 LAB — CBC WITH DIFFERENTIAL/PLATELET
Basophils Absolute: 0 10*3/uL (ref 0–0.1)
Basophils Relative: 1 %
EOS ABS: 0.1 10*3/uL (ref 0–0.7)
EOS PCT: 2 %
HCT: 46.8 % (ref 40.0–52.0)
Hemoglobin: 15.9 g/dL (ref 13.0–18.0)
LYMPHS ABS: 1.5 10*3/uL (ref 1.0–3.6)
Lymphocytes Relative: 32 %
MCH: 31.8 pg (ref 26.0–34.0)
MCHC: 34 g/dL (ref 32.0–36.0)
MCV: 93.5 fL (ref 80.0–100.0)
MONO ABS: 0.5 10*3/uL (ref 0.2–1.0)
MONOS PCT: 11 %
Neutro Abs: 2.6 10*3/uL (ref 1.4–6.5)
Neutrophils Relative %: 54 %
PLATELETS: 103 10*3/uL — AB (ref 150–440)
RBC: 5.01 MIL/uL (ref 4.40–5.90)
RDW: 13 % (ref 11.5–14.5)
WBC: 4.8 10*3/uL (ref 3.8–10.6)

## 2017-05-09 LAB — PROTIME-INR
INR: 1.04
Prothrombin Time: 13.5 seconds (ref 11.4–15.2)

## 2017-05-09 MED ORDER — APIXABAN 5 MG PO TABS
10.0000 mg | ORAL_TABLET | Freq: Two times a day (BID) | ORAL | Status: DC
  Administered 2017-05-09: 10 mg via ORAL
  Filled 2017-05-09: qty 2

## 2017-05-09 NOTE — ED Notes (Signed)
Patient placed in wheelchair and made aware that he is waiting for US.  No new complaints verbalized.

## 2017-05-09 NOTE — ED Notes (Signed)
NAD noted at time of D/C. Pt denies questions or concerns. Pt ambulatory to the lobby at this time.  

## 2017-05-09 NOTE — Discharge Instructions (Signed)
Take the Eliquis as directed. Follow-up with your doctor call them today for follow-up appointment. Return for increased pain swelling or any chest pain or shortness of breath.

## 2017-05-09 NOTE — ED Notes (Signed)
Pt taken to US at this time

## 2017-05-09 NOTE — ED Provider Notes (Signed)
[facility] Emergency Department Provider Note   ____________________________________________   First MD Initiated Contact with Patient 05/09/17 1035     (approximate)  I have reviewed the triage vital signs and the nursing notes.   HISTORY  Chief Complaint Leg Swelling   HPI Alexander [name]. is a 51 y.o. male who reports left leg swelling beginning yesterday. His leg was swollen warm and somewhat tender. 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 He came off Coumadin in April he says to me and the nurse. He told me that the swelling in his leg is little bit less today than yesterday the left leg that is he is not having any chest pain or shortness of breath. Patient's wife says he had a headache 2 days ago he does not have a headache now. He has not had a headache today.  Past Medical History:  Diagnosis Date  . DVT (deep venous thrombosis) [facility])     Patient Active Problem List   Diagnosis Date Noted  . DVT of popliteal vein (HCC) 10/09/2016  . Pulmonary emboli (HCC) 06/27/2016    Past Surgical History:  Procedure Laterality Date  . NASAL FRACTURE SURGERY    . VASECTOMY      Prior to Admission medications   Medication Sig Start Date End Date Taking? Authorizing Provider  Multiple Vitamin (MULTI-VITAMINS) TABS Take 1 tablet by mouth daily.   Yes [provider]  naproxen sodium (ANAPROX) 220 MG tablet Take 220 mg by mouth 2 (two) times daily with a meal.   Yes [provider]  amLODipine (NORVASC) 10 MG tablet Take 1 tablet (10 mg total) by mouth daily. Patient not taking: Reported on 10/09/2016 06/29/16   Altamese Dilling, MD  enoxaparin (LOVENOX) 100 MG/ML injection Inject 1 mL (100 mg total) into the skin every 12 (twelve) hours. Patient not taking: Reported on 05/09/2017 06/28/16 07/03/16  Altamese Dilling, MD  ibuprofen (ADVIL,MOTRIN) 800 MG tablet Take 1 tablet (800 mg total) by mouth every  8 (eight) hours as needed. Patient not taking: Reported on 10/09/2016 08/17/15   Evangeline Dakin, PA-C  warfarin (COUMADIN) 5 MG tablet Take 1 tablet (5 mg total) by mouth daily at 6 PM. Patient not taking: Reported on 05/09/2017 06/28/16   Altamese Dilling, MD    Allergies Oxycodone-acetaminophen and Tramadol  Family History  Problem Relation Age of Onset  . Heart attack Father   . Stroke Father     Social History Social History  Substance Use Topics  . Smoking status: Never Smoker  . Smokeless tobacco: Never Used  . Alcohol use No    Review of Systems  Constitutional: No fever/chills Eyes: No visual changes. ENT: No sore throat. Cardiovascular: Denies chest pain. Respiratory: Denies shortness of breath. Gastrointestinal: No abdominal pain.  No nausea, no vomiting.  No diarrhea.  No constipation. Genitourinary: Negative for dysuria. Musculoskeletal: Negative for back pain. Skin: Negative for rash. Neurological: Negative for headaches, focal weakness   ____________________________________________   PHYSICAL EXAM:  VITAL SIGNS: ED Triage Vitals  Enc Vitals Group     BP 05/09/17 0935 (!) 143/98     Pulse Rate 05/09/17 0935 82     Resp 05/09/17 0935 18     Temp 05/09/17 0935 97.9 F (36.6 C)     Temp Source 05/09/17 0935 Oral     SpO2 05/09/17 0935 100 %     Weight 05/09/17 0934 220 lb (99.8 kg)  Height 05/09/17 0934 5\' 10"  (1.778 m)     Head Circumference --      Peak Flow --      Pain Score 05/09/17 0934 5     Pain Loc --      Pain Edu? --      Excl. in GC? --     Constitutional: Alert and oriented. Well appearing and in no acute distress. Eyes: Conjunctivae are normal.  Head: Atraumatic. Nose: No congestion/rhinnorhea. Mouth/Throat: Mucous membranes are moist.  Oropharynx non-erythematous. Neck: No stridor. Cardiovascular: Normal rate, regular rhythm. Grossly normal heart sounds.  Good peripheral circulation. Respiratory: Normal respiratory  effort.  No retractions. Lungs CTAB. Gastrointestinal: Soft and nontender. No distention. Slight trace abdominal bruits. No CVA tenderness. Musculoskeletal: No lower extremity tenderness nor edema.  No joint effusions. Neurologic:  Normal speech and language. No gross focal neurologic deficits are appreciated. No gait instability. Skin:  Skin is warm, dry and intact. No rash noted. Psychiatric: Mood and affect are normal. Speech and behavior are normal.  ____________________________________________   LABS (all labs ordered are listed, but only abnormal results are displayed)  Labs Reviewed  CBC WITH DIFFERENTIAL/PLATELET - Abnormal; Notable for the following:       Result Value   Platelets 103 (*)    All other components within normal limits  COMPREHENSIVE METABOLIC PANEL - Abnormal; Notable for the following:    BUN 26 (*)    All other components within normal limits  PROTIME-INR   ____________________________________________  EKG   ____________________________________________  RADIOLOGY  US Venous Img Lower Unilateral Left  Result Date: 05/09/2017 CLINICAL DATA:  Left leg swelling for the past 2 days with pain in the calf. Previous right tibial deep venous thrombosis. EXAM: LEFT LOWER EXTREMITY VENOUS DOPPLER ULTRASOUND TECHNIQUE: Gray-scale sonography with graded compression, as well as color Doppler and duplex ultrasound were performed to evaluate the lower extremity deep venous systems from the level of the common femoral vein and including the common femoral, femoral, profunda femoral, popliteal and calf veins including the posterior tibial, peroneal and gastrocnemius veins when visible. The superficial great saphenous vein was also interrogated. Spectral Doppler was utilized to evaluate flow at rest and with distal augmentation maneuvers in the common femoral, femoral and popliteal veins. COMPARISON:  None. FINDINGS: Contralateral Common Femoral Vein: Respiratory phasicity is  normal and symmetric with the symptomatic side. No evidence of thrombus. Normal compressibility. Common Femoral Vein: No evidence of thrombus. Normal compressibility, respiratory phasicity and response to augmentation. Saphenofemoral Junction: No evidence of thrombus. Normal compressibility and flow on color Doppler imaging. Profunda Femoral Vein: No evidence of thrombus. Normal compressibility and flow on color Doppler imaging. Femoral Vein: No evidence of thrombus. Normal compressibility, respiratory phasicity and response to augmentation. Popliteal Vein: Thrombosis almost completely occluding the proximal popliteal vein and completely occluding the remainder of the popliteal vein. Calf Veins: Thrombus occluding the posterior tibial and peroneal veins. Superficial Great Saphenous Vein: No evidence of thrombus. Normal compressibility and flow on color Doppler imaging. Venous Reflux:  None. Other Findings:  None. IMPRESSION: Deep venous thrombosis completely occluding the popliteal, posterior tibial and peroneal veins. Electronically Signed   By: Beckie Salts M.D.   On: 05/09/2017 11:04    ____________________________________________   PROCEDURES  Procedure(s) performed:  Procedures  Critical Care performed:   ____________________________________________   INITIAL IMPRESSION / ASSESSMENT AND PLAN / ED COURSE  Pertinent labs & imaging results that were available during my care of the patient were reviewed by  me and considered in my medical decision making (see chart for details).        ____________________________________________   FINAL CLINICAL IMPRESSION(S) / ED DIAGNOSES  Final diagnoses:  Acute deep vein thrombosis (DVT) of lower extremity, unspecified laterality, unspecified vein (HCC)      NEW MEDICATIONS STARTED DURING THIS VISIT:  New Prescriptions   No medications on file     Note:  This document was prepared using Dragon voice recognition software and may  include unintentional dictation errors.    Arnaldo NatalMalinda, Con Arganbright F, MD 05/09/17 [phone]

## 2017-05-09 NOTE — ED Notes (Signed)
Megan RN aware of patient placement.

## 2017-05-09 NOTE — ED Triage Notes (Signed)
Pt reports left leg swelling that began yesterday. Pt reports history of DVT in right leg that traveled to lungs. Pt denies any recent travel. Pt reports came off coumadin in April 2018. Left pedal pulses palpable in triage, no discoloration noted to left leg.

## 2017-05-09 NOTE — ED Notes (Signed)
This RN to bedside to introduce self to patient, patient placed on monitor at this time. NAD noted. Delay explained, will continue to monitor for further patient needs.

## 2017-05-10 DIAGNOSIS — I2699 Other pulmonary embolism without acute cor pulmonale: Secondary | ICD-10-CM | POA: Diagnosis not present

## 2017-05-10 DIAGNOSIS — Z7901 Long term (current) use of anticoagulants: Secondary | ICD-10-CM | POA: Diagnosis not present

## 2017-05-10 DIAGNOSIS — M79662 Pain in left lower leg: Secondary | ICD-10-CM | POA: Diagnosis not present

## 2017-05-14 DIAGNOSIS — I82403 Acute embolism and thrombosis of unspecified deep veins of lower extremity, bilateral: Secondary | ICD-10-CM | POA: Diagnosis not present

## 2017-05-15 ENCOUNTER — Telehealth: Payer: Self-pay | Admitting: Internal Medicine

## 2017-05-15 NOTE — Telephone Encounter (Signed)
Discussed with Dr.Linthavong; will plan to see pt next week 9/11 in [place] for recurrent DVT.

## 2017-05-15 NOTE — Telephone Encounter (Addendum)
[facility]

## 2017-05-21 ENCOUNTER — Encounter: Payer: Self-pay | Admitting: Internal Medicine

## 2017-05-21 ENCOUNTER — Inpatient Hospital Stay: Payer: 59 | Attending: Internal Medicine | Admitting: Internal Medicine

## 2017-05-21 DIAGNOSIS — Z86711 Personal history of pulmonary embolism: Secondary | ICD-10-CM | POA: Insufficient documentation

## 2017-05-21 DIAGNOSIS — Z8052 Family history of malignant neoplasm of bladder: Secondary | ICD-10-CM | POA: Diagnosis not present

## 2017-05-21 DIAGNOSIS — D6859 Other primary thrombophilia: Secondary | ICD-10-CM | POA: Insufficient documentation

## 2017-05-21 DIAGNOSIS — Z86718 Personal history of other venous thrombosis and embolism: Secondary | ICD-10-CM | POA: Insufficient documentation

## 2017-05-21 DIAGNOSIS — Z79899 Other long term (current) drug therapy: Secondary | ICD-10-CM | POA: Diagnosis not present

## 2017-05-21 DIAGNOSIS — Z7901 Long term (current) use of anticoagulants: Secondary | ICD-10-CM | POA: Diagnosis not present

## 2017-05-21 DIAGNOSIS — D696 Thrombocytopenia, unspecified: Secondary | ICD-10-CM | POA: Diagnosis not present

## 2017-05-21 DIAGNOSIS — I824Z2 Acute embolism and thrombosis of unspecified deep veins of left distal lower extremity: Secondary | ICD-10-CM

## 2017-05-21 LAB — CBC WITH DIFFERENTIAL/PLATELET
BASOS ABS: 0.1 10*3/uL (ref 0–0.1)
BASOS PCT: 1 %
EOS ABS: 0 10*3/uL (ref 0–0.7)
Eosinophils Relative: 1 %
HEMATOCRIT: 46 % (ref 40.0–52.0)
HEMOGLOBIN: 15.8 g/dL (ref 13.0–18.0)
Lymphocytes Relative: 33 %
Lymphs Abs: 2.1 10*3/uL (ref 1.0–3.6)
MCH: 31.7 pg (ref 26.0–34.0)
MCHC: 34.5 g/dL (ref 32.0–36.0)
MCV: 92.1 fL (ref 80.0–100.0)
MONOS PCT: 10 %
Monocytes Absolute: 0.6 10*3/uL (ref 0.2–1.0)
NEUTROS ABS: 3.6 10*3/uL (ref 1.4–6.5)
NEUTROS PCT: 55 %
Platelets: 132 10*3/uL — ABNORMAL LOW (ref 150–440)
RBC: 4.99 MIL/uL (ref 4.40–5.90)
RDW: 13 % (ref 11.5–14.5)
WBC: 6.5 10*3/uL (ref 3.8–10.6)

## 2017-05-21 LAB — COMPREHENSIVE METABOLIC PANEL
ALK PHOS: 60 U/L (ref 38–126)
ALT: 22 U/L (ref 17–63)
AST: 23 U/L (ref 15–41)
Albumin: 4.7 g/dL (ref 3.5–5.0)
Anion gap: 7 (ref 5–15)
BILIRUBIN TOTAL: 1 mg/dL (ref 0.3–1.2)
BUN: 27 mg/dL — ABNORMAL HIGH (ref 6–20)
CALCIUM: 9.4 mg/dL (ref 8.9–10.3)
CO2: 26 mmol/L (ref 22–32)
CREATININE: 1.27 mg/dL — AB (ref 0.61–1.24)
Chloride: 104 mmol/L (ref 101–111)
Glucose, Bld: 102 mg/dL — ABNORMAL HIGH (ref 65–99)
Potassium: 3.8 mmol/L (ref 3.5–5.1)
SODIUM: 137 mmol/L (ref 135–145)
TOTAL PROTEIN: 7.8 g/dL (ref 6.5–8.1)

## 2017-05-21 NOTE — Progress Notes (Signed)
[facility] CONSULT NOTE  Patient Care Team: Marisue Ivan, MD as PCP - General (Family Medicine)  CHIEF COMPLAINTS/PURPOSE OF CONSULTATION: recurrent DVT/PE   # October 2017- R LE DVT/Sub-massive Bil PE [positive for right heart strain]  RIGHT LE DVT [Occlusive DVT within the right popliteal vein/ peroneal veins];on coumadin 6 months [Dr.Khan]; off in April 20th 2018.  # AUG 30th 2018- Left LE DVT [ DVT completely occluding the popliteal, posterior tibial and peroneal veins. On eliquis   # MILD THROMBOCYTOPENIA [90-130s; at least since July 2017]     No history exists.     HISTORY OF PRESENTING ILLNESS:  Alexander [name]. 51 y.o.  male with a history of recurrent DVT- is here for initial consultation.   Patient admits to history of right lower extremity DVT/ submassive bilateral PE in October 2017 for which was treated with Coumadin for 6 months. Patient cites a trip to [place] prior to the thromboembolic event.   Patient however noted to have a cramping pain in his left lower extremity late August 2018- Venous dopplers again showed lower extremity DVT- patient was started on Eliquis. Patient denies any preceding event like fall trauma/travel etc. prior to this thrombotic event.  Patient admits to mild swelling of his left lower extremity; however he has been wearing a stocking. This has been helping.  Patient denies any family history of venous blood clots; or sudden deaths in the family. Father died of CAD/stroke had bladder cancer; uncle died of stroke. Patient does not smoke. No history of his siblings having any blood clots.   ROS: A complete 10 point review of system is done which is negative except mentioned above in history of present illness  MEDICAL HISTORY:  Past Medical History:  Diagnosis Date  . DVT (deep venous thrombosis) (HCC)     SURGICAL HISTORY: Past Surgical History:  Procedure Laterality Date  . NASAL FRACTURE SURGERY    . VASECTOMY       SOCIAL HISTORY: Patient has 2 younger  Brothers; son/31; daughter-28; NO smoking; Prior heavy drinking - 2000; current abstinent. He owns a sign buisness.  Social History   Social History  . Marital status: Married    Spouse name: N/A  . Number of children: N/A  . Years of education: N/A   Occupational History  . Not on file.   Social History Main Topics  . Smoking status: Never Smoker  . Smokeless tobacco: Never Used  . Alcohol use No  . Drug use: No  . Sexual activity: Not on file   Other Topics Concern  . Not on file   Social History Narrative  . No narrative on file    FAMILY HISTORY: Family History  Problem Relation Age of Onset  . Heart attack Father   . Stroke Father   . Cancer Father 59       Prostate &/or bladder    ALLERGIES:  is allergic to oxycodone-acetaminophen and tramadol.  MEDICATIONS:  Current Outpatient Prescriptions  Medication Sig Dispense Refill  . acetaminophen (TYLENOL) 500 MG tablet Take by mouth.    Marland Kitchen apixaban (ELIQUIS) 5 MG TABS tablet Take by mouth.    Marland Kitchen ibuprofen (ADVIL,MOTRIN) 800 MG tablet Take 1 tablet (800 mg total) by mouth every 8 (eight) hours as needed. 30 tablet 0  . amLODipine (NORVASC) 10 MG tablet Take 1 tablet (10 mg total) by mouth daily. (Patient not taking: Reported on 10/09/2016) 30 tablet 0  . enoxaparin (LOVENOX) 100  MG/ML injection Inject 1 mL (100 mg total) into the skin every 12 (twelve) hours. (Patient not taking: Reported on 05/09/2017) 10 Syringe 0  . Multiple Vitamin (MULTI-VITAMINS) TABS Take 1 tablet by mouth daily.    . naproxen sodium (ANAPROX) 220 MG tablet Take 220 mg by mouth 2 (two) times daily with a meal.     No current facility-administered medications for this visit.       Marland Kitchen  PHYSICAL EXAMINATION: ECOG PERFORMANCE STATUS: 0 - Asymptomatic  Vitals:   05/21/17 1357  BP: 123/85  Pulse: 74  Resp: 16  Temp: 97.6 F (36.4 C)   Filed Weights   05/21/17 1357  Weight: 212 lb 8.4 oz (96.4  kg)    GENERAL: Well-nourished well-developed; Alert, no distress and comfortable.   Alone.  EYES: no pallor or icterus OROPHARYNX: no thrush or ulceration; good dentition  NECK: supple, no masses felt LYMPH:  no palpable lymphadenopathy in the cervical, axillary or inguinal regions LUNGS: clear to auscultation and  No wheeze or crackles HEART/CVS: regular rate & rhythm and no murmurs; No lower extremity edema; he is wearing right lower extremity stocking.  ABDOMEN: abdomen soft, non-tender and normal bowel sounds Musculoskeletal:no cyanosis of digits and no clubbing  PSYCH: alert & oriented x 3 with fluent speech NEURO: no focal motor/sensory deficits SKIN:  no rashes or significant lesions  LABORATORY DATA:  I have reviewed the data as listed Lab Results  Component Value Date   WBC 6.5 05/21/2017   HGB 15.8 05/21/2017   HCT 46.0 05/21/2017   MCV 92.1 05/21/2017   PLT 132 (L) 05/21/2017    Recent Labs  06/29/16 0429 05/09/17 1126 05/21/17 1441  NA 139 140 137  K 3.8 4.0 3.8  CL 109 108 104  CO2 GLUCOSE 126* 97 102*  BUN 21* 26* 27*  CREATININE 1.15 1.13 1.27*  CALCIUM 8.7* 9.3 9.4  GFRNONAA >60 >60 >60  GFRAA >60 >60 >60  PROT  --  7.4 7.8  ALBUMIN  --  4.5 4.7  AST  --  30 23  ALT  --  26 22  ALKPHOS  --  56 60  BILITOT  --  1.0 1.0    RADIOGRAPHIC STUDIES: I have personally reviewed the radiological images as listed and agreed with the findings in the report. US Venous Img Lower Unilateral Left  Result Date: 05/09/2017 CLINICAL DATA:  Left leg swelling for the past 2 days with pain in the calf. Previous right tibial deep venous thrombosis. EXAM: LEFT LOWER EXTREMITY VENOUS DOPPLER ULTRASOUND TECHNIQUE: Gray-scale sonography with graded compression, as well as color Doppler and duplex ultrasound were performed to evaluate the lower extremity deep venous systems from the level of the common femoral vein and including the common femoral, femoral,  profunda femoral, popliteal and calf veins including the posterior tibial, peroneal and gastrocnemius veins when visible. The superficial great saphenous vein was also interrogated. Spectral Doppler was utilized to evaluate flow at rest and with distal augmentation maneuvers in the common femoral, femoral and popliteal veins. COMPARISON:  None. FINDINGS: Contralateral Common Femoral Vein: Respiratory phasicity is normal and symmetric with the symptomatic side. No evidence of thrombus. Normal compressibility. Common Femoral Vein: No evidence of thrombus. Normal compressibility, respiratory phasicity and response to augmentation. Saphenofemoral Junction: No evidence of thrombus. Normal compressibility and flow on color Doppler imaging. Profunda Femoral Vein: No evidence of thrombus. Normal compressibility and flow on color Doppler imaging. Femoral Vein: No evidence  of thrombus. Normal compressibility, respiratory phasicity and response to augmentation. Popliteal Vein: Thrombosis almost completely occluding the proximal popliteal vein and completely occluding the remainder of the popliteal vein. Calf Veins: Thrombus occluding the posterior tibial and peroneal veins. Superficial Great Saphenous Vein: No evidence of thrombus. Normal compressibility and flow on color Doppler imaging. Venous Reflux:  None. Other Findings:  None. IMPRESSION: Deep venous thrombosis completely occluding the popliteal, posterior tibial and peroneal veins. Electronically Signed   By: Beckie SaltsSteven  Reid M.D.   On: 05/09/2017 11:04    ASSESSMENT & PLAN:   Acute deep vein thrombosis (DVT) of distal vein of left lower extremity (HCC) # Acute left lower extremity DVT [prior right lower extremity/PE]- unprovoked; recurrent. Etiology is unclear. Continued Eliquis for now.   # Recommend thrombophilia workup given recurrent/unprovoked DVT. Discussed that this might have implications for the patient/ and also for his family. He is agreeable.  # I  also discussed the serious concerns of bleeding while on anticoagulation with Eliquis- especially as this does not have an antidote. Specifically recommend to avoid situations which could involve trauma/falls etc.   # MILD THROMBOCYTOPENIA- ? Etiology; monitor for now.   # Patient follow-up with me in approximately 3 weeks; to review the above blood work.   Thank you Dr. Burnadette PopLinthavong for allowing me to participate in the care of your pleasant patient. Please do not hesitate to contact me with questions or concerns in the interim.  All questions were answered. The patient knows to call the clinic with any problems, questions or concerns.    Earna CoderGovinda R Brahmanday, MD 05/28/2017 7:55 AM

## 2017-05-22 LAB — PROTEIN S, TOTAL: Protein S Ag, Total: 65 % (ref 60–150)

## 2017-05-22 LAB — ANTITHROMBIN III: AntiThromb III Func: 103 % (ref 75–120)

## 2017-05-22 LAB — FIBRINOGEN: Fibrinogen: 281 mg/dL (ref 210–475)

## 2017-05-23 LAB — PROTEIN C, TOTAL: Protein C, Total: 84 % (ref 60–150)

## 2017-05-23 LAB — CARDIOLIPIN ANTIBODIES, IGG, IGM, IGA
Anticardiolipin IgA: <9 APL U/mL (ref 0–11)
Anticardiolipin IgG: 19 GPL U/mL — ABNORMAL HIGH (ref 0–14)
Anticardiolipin IgM: 99 MPL U/mL — ABNORMAL HIGH (ref 0–12)

## 2017-05-24 LAB — PROTHROMBIN GENE MUTATION

## 2017-05-24 LAB — BETA-2-GLYCOPROTEIN I ABS, IGG/M/A
Beta-2 Glyco I IgG: <9 GPI IgG units (ref 0–20)
Beta-2-Glycoprotein I IgA: <9 GPI IgA units (ref 0–25)
Beta-2-Glycoprotein I IgM: 139 GPI IgM units — ABNORMAL HIGH (ref 0–32)

## 2017-05-24 LAB — FACTOR 5 LEIDEN

## 2017-05-26 NOTE — Assessment & Plan Note (Addendum)
#   Acute left lower extremity DVT [prior right lower extremity/PE]- unprovoked; recurrent. Etiology is unclear. Continued Eliquis for now.   # Recommend thrombophilia workup given recurrent/unprovoked DVT. Discussed that this might have implications for the patient/ and also for his family. He is agreeable.  # I also discussed the serious concerns of bleeding while on anticoagulation with Eliquis- especially as this does not have an antidote. Specifically recommend to avoid situations which could involve trauma/falls etc.   # MILD THROMBOCYTOPENIA- ? Etiology; monitor for now.   # Patient follow-up with me in approximately 3 weeks; to review the above blood work.   Thank you Dr. Burnadette Pop for allowing me to participate in the care of your pleasant patient. Please do not hesitate to contact me with questions or concerns in the interim.

## 2017-06-06 ENCOUNTER — Inpatient Hospital Stay (HOSPITAL_BASED_OUTPATIENT_CLINIC_OR_DEPARTMENT_OTHER): Payer: 59 | Admitting: Internal Medicine

## 2017-06-06 VITALS — BP 131/91 | HR 74 | Temp 97.6°F | Resp 20 | Ht 70.0 in | Wt 212.0 lb

## 2017-06-06 DIAGNOSIS — Z86718 Personal history of other venous thrombosis and embolism: Secondary | ICD-10-CM

## 2017-06-06 DIAGNOSIS — Z86711 Personal history of pulmonary embolism: Secondary | ICD-10-CM

## 2017-06-06 DIAGNOSIS — I824Z2 Acute embolism and thrombosis of unspecified deep veins of left distal lower extremity: Secondary | ICD-10-CM

## 2017-06-06 DIAGNOSIS — D696 Thrombocytopenia, unspecified: Secondary | ICD-10-CM

## 2017-06-06 DIAGNOSIS — Z79899 Other long term (current) drug therapy: Secondary | ICD-10-CM | POA: Diagnosis not present

## 2017-06-06 DIAGNOSIS — Z7901 Long term (current) use of anticoagulants: Secondary | ICD-10-CM | POA: Diagnosis not present

## 2017-06-06 NOTE — Progress Notes (Signed)
[facility] CONSULT NOTE  Patient Care Team: Marisue Ivan, MD as PCP - General (Family Medicine)  CHIEF COMPLAINTS/PURPOSE OF CONSULTATION: recurrent DVT/PE  # October 2017- R LE DVT/Sub-massive Bil PE [positive for right heart strain; Trip to [place]]  RIGHT LE DVT [Occlusive DVT within the right popliteal vein/ peroneal veins];on coumadin 6 months [Dr.Khan]; off in April 20th 2018.   # AUG 30th 2018- Left LE DVT [ DVT completely occluding the popliteal, posterior tibial and peroneal veins. On eliquis ? SEP 2018- ANTI-PHOSPHOLIPID ANTIBODY PANEL-ANTIBODIES-POSITIVE; repeat work up-pending [NEG- factor V Leiden/Prothrombin gene/Protein C&S/Anti-thrombin III]  # MILD THROMBOCYTOPENIA [90-130s; at least since July 2017]    No history exists.   HISTORY OF PRESENTING ILLNESS:  Alexander [name]. 51 y.o.  male with a history of recurrent DVT; And history of PE- is here to review the results of his workup.  Patient continues to be on Eliquis. Denies any blood in stools black red stools. Denies any falls. He denies any  bleeding from his gums or nose. He does notice to easy bruising.   ROS: A complete 10 point review of system is done which is negative except mentioned above in history of present illness  MEDICAL HISTORY:  Past Medical History:  Diagnosis Date  . DVT (deep venous thrombosis) (HCC)     SURGICAL HISTORY: Past Surgical History:  Procedure Laterality Date  . NASAL FRACTURE SURGERY    . VASECTOMY      SOCIAL HISTORY: Patient has 2 younger  Brothers; son/31; daughter-28; NO smoking; Prior heavy drinking - 2000; current abstinent. He owns a sign buisness.  Social History   Social History  . Marital status: Married    Spouse name: N/A  . Number of children: N/A  . Years of education: N/A   Occupational History  . Not on file.   Social History Main Topics  . Smoking status: Never Smoker  . Smokeless tobacco: Never Used  . Alcohol use No  .  Drug use: No  . Sexual activity: Not on file   Other Topics Concern  . Not on file   Social History Narrative  . No narrative on file    FAMILY HISTORY: Family History  Problem Relation Age of Onset  . Heart attack Father   . Stroke Father   . Cancer Father 57       Prostate &/or bladder    ALLERGIES:  is allergic to oxycodone-acetaminophen and tramadol.  MEDICATIONS:  Current Outpatient Prescriptions  Medication Sig Dispense Refill  . apixaban (ELIQUIS) 5 MG TABS tablet Take by mouth.     No current facility-administered medications for this visit.       Marland Kitchen  PHYSICAL EXAMINATION: ECOG PERFORMANCE STATUS: 0 - Asymptomatic  Vitals:   06/06/17 0956  BP: (!) 131/91  Pulse: 74  Resp: 20  Temp: 97.6 F (36.4 C)   Filed Weights   06/06/17 0956  Weight: 212 lb (96.2 kg)    GENERAL: Well-nourished well-developed; Alert, no distress and comfortable.   Accompanied by his wife.  EYES: no pallor or icterus OROPHARYNX: no thrush or ulceration; good dentition  NECK: supple, no masses felt LYMPH:  no palpable lymphadenopathy in the cervical, axillary or inguinal regions LUNGS: clear to auscultation and  No wheeze or crackles HEART/CVS: regular rate & rhythm and no murmurs; No lower extremity edema; he is wearing right lower extremity stocking.  ABDOMEN: abdomen soft, non-tender and normal bowel sounds Musculoskeletal:no cyanosis of digits and  no clubbing  PSYCH: alert & oriented x 3 with fluent speech NEURO: no focal motor/sensory deficits SKIN:  no rashes or significant lesions  LABORATORY DATA:  I have reviewed the data as listed Lab Results  Component Value Date   WBC 6.5 05/21/2017   HGB 15.8 05/21/2017   HCT 46.0 05/21/2017   MCV 92.1 05/21/2017   PLT 132 (L) 05/21/2017    Recent Labs  06/29/16 0429 05/09/17 1126 05/21/17 1441  NA 139 140 137  K 3.8 4.0 3.8  CL 109 108 104  CO2 GLUCOSE 126* 97 102*  BUN 21* 26* 27*  CREATININE 1.15  1.13 1.27*  CALCIUM 8.7* 9.3 9.4  GFRNONAA >60 >60 >60  GFRAA >60 >60 >60  PROT  --  7.4 7.8  ALBUMIN  --  4.5 4.7  AST  --  30 23  ALT  --  26 22  ALKPHOS  --  56 60  BILITOT  --  1.0 1.0    RADIOGRAPHIC STUDIES: I have personally reviewed the radiological images as listed and agreed with the findings in the report. US Venous Img Lower Unilateral Left  Result Date: 05/09/2017 CLINICAL DATA:  Left leg swelling for the past 2 days with pain in the calf. Previous right tibial deep venous thrombosis. EXAM: LEFT LOWER EXTREMITY VENOUS DOPPLER ULTRASOUND TECHNIQUE: Gray-scale sonography with graded compression, as well as color Doppler and duplex ultrasound were performed to evaluate the lower extremity deep venous systems from the level of the common femoral vein and including the common femoral, femoral, profunda femoral, popliteal and calf veins including the posterior tibial, peroneal and gastrocnemius veins when visible. The superficial great saphenous vein was also interrogated. Spectral Doppler was utilized to evaluate flow at rest and with distal augmentation maneuvers in the common femoral, femoral and popliteal veins. COMPARISON:  None. FINDINGS: Contralateral Common Femoral Vein: Respiratory phasicity is normal and symmetric with the symptomatic side. No evidence of thrombus. Normal compressibility. Common Femoral Vein: No evidence of thrombus. Normal compressibility, respiratory phasicity and response to augmentation. Saphenofemoral Junction: No evidence of thrombus. Normal compressibility and flow on color Doppler imaging. Profunda Femoral Vein: No evidence of thrombus. Normal compressibility and flow on color Doppler imaging. Femoral Vein: No evidence of thrombus. Normal compressibility, respiratory phasicity and response to augmentation. Popliteal Vein: Thrombosis almost completely occluding the proximal popliteal vein and completely occluding the remainder of the popliteal vein. Calf  Veins: Thrombus occluding the posterior tibial and peroneal veins. Superficial Great Saphenous Vein: No evidence of thrombus. Normal compressibility and flow on color Doppler imaging. Venous Reflux:  None. Other Findings:  None. IMPRESSION: Deep venous thrombosis completely occluding the popliteal, posterior tibial and peroneal veins. Electronically Signed   By: Beckie Salts M.D.   On: 05/09/2017 11:04    ASSESSMENT & PLAN:   Acute deep vein thrombosis (DVT) of distal vein of left lower extremity (HCC) # Acute left lower extremity DVT [prior right lower extremity/PE]- unprovoked; recurrent. Etiology is unclear- however suspicious for antiphospholipid antibody syndrome [C discussion below]. Continued Eliquis for now.   # APS- work up positive for antibodies; however DRVVT/lupus anticoagulant panel cannot be done as patient is on Eliquis. I had a long discussion the patient and wife regarding the importance of repeating antiphospholipid antibody syndrome workup- before confirming the diagnosis. However if the patient is diagnosed with antiphospholipid antibody syndrome- he would likely need lifelong anticoagulation.  # I also discussed the serious concerns of bleeding while on  anticoagulation with Eliquis- especially as this does not have an antidote. Specifically recommend to avoid situations which could involve trauma/falls etc.   # MILD THROMBOCYTOPENIA- ? Etiology; question related to antiphospholipid antibody syndrome monitor for now  # follow up in 3 months/ no labs; will have to stop eliquis for further to repeat testing for APS; will need to be on asprin while off anticoagulation.   Cc; Dr.L   All questions were answered. The patient knows to call the clinic with any problems, questions or concerns.    Earna Coder, MD 06/06/2017 3:06 PM

## 2017-06-06 NOTE — Assessment & Plan Note (Addendum)
#   Acute left lower extremity DVT [prior right lower extremity/PE]- unprovoked; recurrent. Etiology is unclear- however suspicious for antiphospholipid antibody syndrome [C discussion below]. Continued Eliquis for now.   # APS- work up positive for antibodies; however DRVVT/lupus anticoagulant panel cannot be done as patient is on Eliquis. I had a long discussion the patient and wife regarding the importance of repeating antiphospholipid antibody syndrome workup- before confirming the diagnosis. However if the patient is diagnosed with antiphospholipid antibody syndrome- he would likely need lifelong anticoagulation.  # I also discussed the serious concerns of bleeding while on anticoagulation with Eliquis- especially as this does not have an antidote. Specifically recommend to avoid situations which could involve trauma/falls etc.   # MILD THROMBOCYTOPENIA- ? Etiology; question related to antiphospholipid antibody syndrome monitor for now  # follow up in 3 months/ no labs; will have to stop eliquis for further to repeat testing for APS; will need to be on asprin while off anticoagulation.   Cc; Dr.L

## 2017-06-06 NOTE — Progress Notes (Signed)
Patient here for follow-up for results. 

## 2017-09-06 ENCOUNTER — Ambulatory Visit: Payer: 59 | Admitting: Internal Medicine

## 2017-10-04 ENCOUNTER — Inpatient Hospital Stay: Payer: 59 | Admitting: Internal Medicine

## 2017-10-04 NOTE — Assessment & Plan Note (Deleted)
#   Acute left lower extremity DVT [prior right lower extremity/PE]- unprovoked; recurrent. Etiology is unclear- however suspicious for antiphospholipid antibody syndrome [C discussion below]. Continued Eliquis for now.   # APS- work up positive for antibodies; however DRVVT/lupus anticoagulant panel cannot be done as patient is on Eliquis. I had a long discussion the patient and wife regarding the importance of repeating antiphospholipid antibody syndrome workup- before confirming the diagnosis. However if the patient is diagnosed with antiphospholipid antibody syndrome- he would likely need lifelong anticoagulation.  # I also discussed the serious concerns of bleeding while on anticoagulation with Eliquis- especially as this does not have an antidote. Specifically recommend to avoid situations which could involve trauma/falls etc.   # MILD THROMBOCYTOPENIA- ? Etiology; question related to antiphospholipid antibody syndrome monitor for now  # follow up in 3 months/ no labs; will have to stop eliquis for further to repeat testing for APS; will need to be on asprin while off anticoagulation.   Cc; Dr.L

## 2017-10-04 NOTE — Progress Notes (Deleted)
[name] CONSULT NOTE  Patient Care Team: Marisue Ivan, MD as PCP - General (Family Medicine)  CHIEF COMPLAINTS/PURPOSE OF CONSULTATION: recurrent DVT/PE  # October 2017- R LE DVT/Sub-massive Bil PE [positive for right heart strain; Trip to [place]]  RIGHT LE DVT [Occlusive DVT within the right popliteal vein/ peroneal veins];on coumadin 6 months [Dr.Khan]; off in April 20th 2018.   # AUG 30th 2018- Left LE DVT [ DVT completely occluding the popliteal, posterior tibial and peroneal veins. On eliquis ? SEP 2018- ANTI-PHOSPHOLIPID ANTIBODY PANEL-ANTIBODIES-POSITIVE; repeat work up-pending [NEG- factor V Leiden/Prothrombin gene/Protein C&S/Anti-thrombin III]  # MILD THROMBOCYTOPENIA [90-130s; at least since July 2017]    No history exists.   HISTORY OF PRESENTING ILLNESS:  Alexander Mc. 52 y.o.  [name] with a history of recurrent DVT; And history of PE- is here to review the results of his workup.  Patient continues to be on Eliquis. Denies any blood in stools black red stools. Denies any falls. He denies any  bleeding from his gums or nose. He does notice to easy bruising.   ROS: A complete 10 point review of system is done which is negative except mentioned above in history of present illness  MEDICAL HISTORY:  Past Medical History:  Diagnosis Date  . DVT (deep venous thrombosis) (HCC)     SURGICAL HISTORY: Past Surgical History:  Procedure Laterality Date  . NASAL FRACTURE SURGERY    . VASECTOMY      SOCIAL HISTORY: Patient has 2 younger  Brothers; son/31; daughter-28; NO smoking; Prior heavy drinking - 2000; current abstinent. He owns a sign buisness.  Social History   Socioeconomic History  . Marital status: Married    Spouse name: Not on file  . Number of children: Not on file  . Years of education: Not on file  . Highest education level: Not on file  Social Needs  . Financial resource strain: Not on file  . Food insecurity - worry: Not  on file  . Food insecurity - inability: Not on file  . Transportation needs - medical: Not on file  . Transportation needs - non-medical: Not on file  Occupational History  . Not on file  Tobacco Use  . Smoking status: Never Smoker  . Smokeless tobacco: Never Used  Substance and Sexual Activity  . Alcohol use: No  . Drug use: No  . Sexual activity: Not on file  Other Topics Concern  . Not on file  Social History Narrative  . Not on file    FAMILY HISTORY: Family History  Problem Relation Age of Onset  . Heart attack Father   . Stroke Father   . Cancer Father 82       Prostate &/or bladder    ALLERGIES:  is allergic to oxycodone-acetaminophen and tramadol.  MEDICATIONS:  Current Outpatient Medications  Medication Sig Dispense Refill  . apixaban (ELIQUIS) 5 MG TABS tablet Take by mouth.     No current facility-administered medications for this visit.       Marland Kitchen  PHYSICAL EXAMINATION: ECOG PERFORMANCE STATUS: 0 - Asymptomatic  There were no vitals filed for this visit. There were no vitals filed for this visit.  GENERAL: Well-nourished well-developed; Alert, no distress and comfortable.   Accompanied by his wife.  EYES: no pallor or icterus OROPHARYNX: no thrush or ulceration; good dentition  NECK: supple, no masses felt LYMPH:  no palpable lymphadenopathy in the cervical, axillary or inguinal regions LUNGS: clear to auscultation and  No wheeze or crackles HEART/CVS: regular rate & rhythm and no murmurs; No lower extremity edema; he is wearing right lower extremity stocking.  ABDOMEN: abdomen soft, non-tender and normal bowel sounds Musculoskeletal:no cyanosis of digits and no clubbing  PSYCH: alert & oriented x 3 with fluent speech NEURO: no focal motor/sensory deficits SKIN:  no rashes or significant lesions  LABORATORY DATA:  I have reviewed the data as listed Lab Results  Component Value Date   WBC 6.5 05/21/2017   HGB 15.8 05/21/2017   HCT 46.0  05/21/2017   MCV 92.1 05/21/2017   PLT 132 (L) 05/21/2017   Recent Labs    05/09/17 1126 05/21/17 1441  NA 140 137  K 4.0 3.8  CL 108 104  CO2 24 26  GLUCOSE 97 102*  BUN 26* 27*  CREATININE 1.13 1.27*  CALCIUM 9.3 9.4  GFRNONAA >60 >60  GFRAA >60 >60  PROT 7.4 7.8  ALBUMIN 4.5 4.7  AST 30 23  ALT 26 22  ALKPHOS 56 60  BILITOT 1.0 1.0    RADIOGRAPHIC STUDIES: I have personally reviewed the radiological images as listed and agreed with the findings in the report. No results found.  ASSESSMENT & PLAN:   No problem-specific Assessment & Plan notes found for this encounter.  All questions were answered. The patient knows to call the clinic with any problems, questions or concerns.    Earna CoderGovinda R Alfonso Carden, MD 10/04/2017 8:57 AM

## 2017-10-09 DIAGNOSIS — J069 Acute upper respiratory infection, unspecified: Secondary | ICD-10-CM | POA: Diagnosis not present

## 2017-10-09 DIAGNOSIS — K529 Noninfective gastroenteritis and colitis, unspecified: Secondary | ICD-10-CM | POA: Diagnosis not present

## 2018-02-14 DIAGNOSIS — B354 Tinea corporis: Secondary | ICD-10-CM | POA: Diagnosis not present

## 2018-04-05 ENCOUNTER — Emergency Department: Payer: 59

## 2018-04-05 ENCOUNTER — Emergency Department
Admission: EM | Admit: 2018-04-05 | Discharge: 2018-04-05 | Disposition: A | Discharge status: Home / Self Care | Payer: 59 | Source: Home / Self Care | Attending: Emergency Medicine | Admitting: Emergency Medicine

## 2018-04-05 ENCOUNTER — Other Ambulatory Visit: Payer: Self-pay

## 2018-04-05 DIAGNOSIS — R0781 Pleurodynia: Secondary | ICD-10-CM | POA: Diagnosis not present

## 2018-04-05 DIAGNOSIS — E2749 Other adrenocortical insufficiency: Secondary | ICD-10-CM | POA: Diagnosis not present

## 2018-04-05 DIAGNOSIS — R109 Unspecified abdominal pain: Secondary | ICD-10-CM | POA: Insufficient documentation

## 2018-04-05 DIAGNOSIS — R1011 Right upper quadrant pain: Secondary | ICD-10-CM | POA: Diagnosis not present

## 2018-04-05 HISTORY — DX: Personal history of pulmonary embolism: Z86.711

## 2018-04-05 LAB — CBC
HCT: 41.8 % (ref 40.0–52.0)
HEMOGLOBIN: 14.6 g/dL (ref 13.0–18.0)
MCH: 33.2 pg (ref 26.0–34.0)
MCHC: 34.9 g/dL (ref 32.0–36.0)
MCV: 95.1 fL (ref 80.0–100.0)
PLATELETS: 164 10*3/uL (ref 150–440)
RBC: 4.39 MIL/uL — ABNORMAL LOW (ref 4.40–5.90)
RDW: 13.6 % (ref 11.5–14.5)
WBC: 5.9 10*3/uL (ref 3.8–10.6)

## 2018-04-05 LAB — BASIC METABOLIC PANEL
ANION GAP: 7 (ref 5–15)
BUN: 20 mg/dL (ref 6–20)
CALCIUM: 9.3 mg/dL (ref 8.9–10.3)
CO2: 28 mmol/L (ref 22–32)
CREATININE: 1.4 mg/dL — AB (ref 0.61–1.24)
Chloride: 107 mmol/L (ref 98–111)
GFR, EST NON AFRICAN AMERICAN: 57 mL/min — AB (ref >60)
Glucose, Bld: 122 mg/dL — ABNORMAL HIGH (ref 70–99)
Potassium: 3.8 mmol/L (ref 3.5–5.1)
SODIUM: 142 mmol/L (ref 135–145)

## 2018-04-05 LAB — HEPATIC FUNCTION PANEL
ALBUMIN: 4.3 g/dL (ref 3.5–5.0)
ALT: 26 U/L (ref 0–44)
AST: 28 U/L (ref 15–41)
Alkaline Phosphatase: 48 U/L (ref 38–126)
Bilirubin, Direct: <0.1 mg/dL (ref 0.0–0.2)
Total Bilirubin: 0.7 mg/dL (ref 0.3–1.2)
Total Protein: 6.9 g/dL (ref 6.5–8.1)

## 2018-04-05 LAB — LIPASE, BLOOD: LIPASE: 55 U/L — AB (ref 11–51)

## 2018-04-05 LAB — TROPONIN I: TROPONIN I: 0.03 ng/mL — AB (ref <0.03)

## 2018-04-05 LAB — FIBRIN DERIVATIVES D-DIMER (ARMC ONLY): FIBRIN DERIVATIVES D-DIMER (ARMC): 335.98 ng{FEU}/mL (ref 0.00–499.00)

## 2018-04-05 MED ORDER — FENTANYL CITRATE (PF) 100 MCG/2ML IJ SOLN
100.0000 ug | Freq: Once | INTRAMUSCULAR | Status: AC
  Administered 2018-04-05: 100 ug via INTRAVENOUS
  Filled 2018-04-05: qty 2

## 2018-04-05 MED ORDER — MORPHINE SULFATE (PF) 4 MG/ML IV SOLN
4.0000 mg | Freq: Once | INTRAVENOUS | Status: AC
  Administered 2018-04-05: 4 mg via INTRAVENOUS
  Filled 2018-04-05: qty 1

## 2018-04-05 MED ORDER — HYDROCODONE-ACETAMINOPHEN 5-325 MG PO TABS: 1.0000 | ORAL_TABLET | ORAL | 0 refills | Status: DC | PRN

## 2018-04-05 MED ORDER — IOPAMIDOL (ISOVUE-300) INJECTION 61%
100.0000 mL | Freq: Once | INTRAVENOUS | Status: AC | PRN
  Administered 2018-04-05: 100 mL via INTRAVENOUS

## 2018-04-05 MED ORDER — VALACYCLOVIR HCL 1 G PO TABS: 1000.0000 mg | ORAL_TABLET | Freq: Three times a day (TID) | ORAL | 0 refills | Status: AC

## 2018-04-05 MED ORDER — HALOPERIDOL LACTATE 5 MG/ML IJ SOLN
5.0000 mg | Freq: Once | INTRAMUSCULAR | Status: AC
  Administered 2018-04-05: 5 mg via INTRAVENOUS
  Filled 2018-04-05: qty 1

## 2018-04-05 NOTE — ED Notes (Signed)
Pt returned from ultrasound

## 2018-04-05 NOTE — ED Triage Notes (Signed)
Pt arrives to ED stating sudden sharp on R side under ribs that began today. States shoots through to back. Hx PE, on eliquis. States still has gallbladder. Denies N&V&D. Denies diaphoresis. Family member talking for pt. Pt answers some questions but family member is answering questions more. Alert, oriented, ambulatory. Denies SOB.

## 2018-04-05 NOTE — ED Notes (Signed)
Patient c/o right upper abdominal pain/chest pain radiating to back described as sharp. Patient reports hx of multiple PEs and DVT. Patient is on Eliquis. Patient reports this pain to be different form PE pain. Patient nauseated, with labored breathing.

## 2018-04-05 NOTE — ED Notes (Signed)
Patient transported to Ultrasound 

## 2018-04-05 NOTE — Discharge Instructions (Addendum)
Please seek medical attention for any high fevers, chest pain, shortness of breath, change in behavior, persistent vomiting, bloody stool or any other new or concerning symptoms.  

## 2018-04-05 NOTE — ED Provider Notes (Signed)
[facility] Emergency Department Provider Note   ____________________________________________   I have reviewed the triage vital signs and the nursing notes.   HISTORY  Chief Complaint Chest Pain   History limited by: Not Limited   HPI Alexander [name]. is a 52 y.o. male who presents to the emergency department today because of abdominal pain.  Is located in the right upper quadrant.  The patient states that the pain started today.  He had not been doing any unusual activity when it started.  Is located right upper quadrant and radiates to his back.  He states it is worse when he tries to take a deep breath.  He denies similar pain in the past.  No vomiting.  No change defecation.  Denies any fevers.  Does have history of PEs and DVTs however states this does not remind him of that pain.   Per medical record review patient has a history of DVT, PE.  Past Medical History:  Diagnosis Date  . DVT (deep venous thrombosis) (HCC)   . History of pulmonary embolus (PE)     Patient Active Problem List   Diagnosis Date Noted  . Primary hypercoagulable state (HCC) 05/21/2017  . Acute deep vein thrombosis (DVT) of distal vein of left lower extremity (HCC) 10/09/2016  . Pulmonary emboli (HCC) 06/27/2016    Past Surgical History:  Procedure Laterality Date  . NASAL FRACTURE SURGERY    . VASECTOMY      Prior to Admission medications   Medication Sig Start Date End Date Taking? Authorizing Provider  apixaban (ELIQUIS) 5 MG TABS tablet Take by mouth.    [provider]    Allergies Oxycodone-acetaminophen and Tramadol  Family History  Problem Relation Age of Onset  . Heart attack Father   . Stroke Father   . Cancer Father 42       Prostate &/or bladder    Social History Social History   Tobacco Use  . Smoking status: Never Smoker  . Smokeless tobacco: Never Used  Substance Use Topics  . 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 Drug use: No    Review of Systems Constitutional: No fever/chills Eyes: No visual changes. ENT: No sore throat. Cardiovascular: Denies chest pain. Respiratory: Denies shortness of breath. Gastrointestinal: Positive for right upper quadrant abdominal pain.  Genitourinary: Negative for dysuria. Musculoskeletal: Negative for back pain. Skin: Negative for rash. Neurological: Negative for headaches, focal weakness or numbness.  ____________________________________________   PHYSICAL EXAM:  VITAL SIGNS: ED Triage Vitals  Enc Vitals Group     BP 04/05/18 1715 (!) 143/104     Pulse Rate 04/05/18 1715 75     Resp 04/05/18 1715 18     Temp 04/05/18 1715 98.2 F (36.8 C)     Temp Source 04/05/18 1715 Oral     SpO2 04/05/18 1715 99 %     Weight 04/05/18 1715 220 lb (99.8 kg)     Height 04/05/18 1715 5\' 10"  (1.778 m)     Head Circumference --      Peak Flow --      Pain Score 04/05/18 1719 9   Constitutional: Alert and oriented.  Eyes: Conjunctivae are normal.  ENT      Head: Normocephalic and atraumatic.      Nose: No congestion/rhinnorhea.      Mouth/Throat: Mucous membranes are moist.      Neck: No stridor. Hematological/Lymphatic/Immunilogical: No cervical lymphadenopathy.  Cardiovascular: Normal rate, regular rhythm.  No murmurs, rubs, or gallops.  Respiratory: Normal respiratory effort without tachypnea nor retractions. Breath sounds are clear and equal bilaterally. No wheezes/rales/rhonchi. Gastrointestinal: Soft and tender to palpation in the right upper quadrant. No rebound. No guarding.  Genitourinary: Deferred Musculoskeletal: Normal range of motion in all extremities. No lower extremity edema. Neurologic:  Normal speech and language. No gross focal neurologic deficits are appreciated.  Skin:  Skin is warm, dry and intact. No rash noted. Psychiatric: Mood and affect are normal. Speech and behavior are normal. Patient exhibits appropriate insight and  judgment.  ____________________________________________    LABS (pertinent positives/negatives)  BMP na 142, k 3.8, glu 122, cr 1.40 CBC wbc 5.9, hgb 14.6, plt 164 Trop 0.03  ____________________________________________   EKG  I, Phineas SemenGraydon Shawna Wearing, attending physician, personally viewed and interpreted this EKG  EKG Time: 1722 Rate: 71 Rhythm: normal sinus rhythm Axis: normal Intervals: qtc 415 QRS: narrow ST changes: no st elevation Impression: normal ekg  ____________________________________________    RADIOLOGY  CXR No acute disease  RUQ US No concerning findings  CT abd/pel No acute findings to explain pain ____________________________________________   PROCEDURES  Procedures  ____________________________________________   INITIAL IMPRESSION / ASSESSMENT AND PLAN / ED COURSE  Pertinent labs & imaging results that were available during my care of the patient were reviewed by me and considered in my medical decision making (see chart for details).   Patient presented to the emergency department today because of concerns for abdominal pain.  Located in the right upper quadrant.  Ultrasound was performed to evaluate for gallbladder disease.  This was normal.  Then CT was performed which also did not show any acute abnormality.  Patient's blood work without any significant leukocytosis otherwise obvious cause of the pain.  Lipase was very minimally elevated.  Troponin was just in the abnormal range however patient not complaining of left-sided chest pain or shortness of breath.  At this point I doubt ACS.  Did discuss possibility of shingles with the patient.  Will plan on discharging with pain medication and Valtrex if he developed a rash.  Discussed importance of return precautions and primary care follow-up.   ____________________________________________   FINAL CLINICAL IMPRESSION(S) / ED DIAGNOSES  Final diagnoses:  Abdominal pain     Note: This  dictation was prepared with Dragon dictation. Any transcriptional errors that result from this process are unintentional     Phineas SemenGoodman, Foster Frericks, MD 04/05/18 2246

## 2018-04-06 ENCOUNTER — Emergency Department (HOSPITAL_COMMUNITY): Payer: 59

## 2018-04-06 ENCOUNTER — Other Ambulatory Visit: Payer: Self-pay

## 2018-04-06 ENCOUNTER — Emergency Department (HOSPITAL_COMMUNITY)
Admission: EM | Admit: 2018-04-06 | Discharge: 2018-04-06 | Disposition: A | Discharge status: Home / Self Care | Payer: 59 | Attending: Emergency Medicine | Admitting: Emergency Medicine

## 2018-04-06 ENCOUNTER — Encounter (HOSPITAL_COMMUNITY): Payer: Self-pay | Admitting: Emergency Medicine

## 2018-04-06 DIAGNOSIS — R1011 Right upper quadrant pain: Secondary | ICD-10-CM | POA: Diagnosis present

## 2018-04-06 DIAGNOSIS — K5900 Constipation, unspecified: Secondary | ICD-10-CM | POA: Insufficient documentation

## 2018-04-06 DIAGNOSIS — R1031 Right lower quadrant pain: Secondary | ICD-10-CM | POA: Diagnosis not present

## 2018-04-06 DIAGNOSIS — Z79899 Other long term (current) drug therapy: Secondary | ICD-10-CM | POA: Diagnosis not present

## 2018-04-06 LAB — CBC
HEMATOCRIT: 44.8 % (ref 39.0–52.0)
Hemoglobin: 15 g/dL (ref 13.0–17.0)
MCH: 31.9 pg (ref 26.0–34.0)
MCHC: 33.5 g/dL (ref 30.0–36.0)
MCV: 95.3 fL (ref 78.0–100.0)
Platelets: 167 10*3/uL (ref 150–400)
RBC: 4.7 MIL/uL (ref 4.22–5.81)
RDW: 13 % (ref 11.5–15.5)
WBC: 10.9 10*3/uL — AB (ref 4.0–10.5)

## 2018-04-06 LAB — COMPREHENSIVE METABOLIC PANEL
ALT: 24 U/L (ref 0–44)
AST: 22 U/L (ref 15–41)
Albumin: 4.5 g/dL (ref 3.5–5.0)
Alkaline Phosphatase: 61 U/L (ref 38–126)
Anion gap: 10 (ref 5–15)
BUN: 11 mg/dL (ref 6–20)
CHLORIDE: 105 mmol/L (ref 98–111)
CO2: 24 mmol/L (ref 22–32)
CREATININE: 1.15 mg/dL (ref 0.61–1.24)
Calcium: 9.5 mg/dL (ref 8.9–10.3)
GFR calc non Af Amer: >60 mL/min (ref >60)
Glucose, Bld: 148 mg/dL — ABNORMAL HIGH (ref 70–99)
POTASSIUM: 3.6 mmol/L (ref 3.5–5.1)
SODIUM: 139 mmol/L (ref 135–145)
Total Bilirubin: 0.9 mg/dL (ref 0.3–1.2)
Total Protein: 7.6 g/dL (ref 6.5–8.1)

## 2018-04-06 LAB — URINALYSIS, ROUTINE W REFLEX MICROSCOPIC
Bilirubin Urine: NEGATIVE
GLUCOSE, UA: NEGATIVE mg/dL
HGB URINE DIPSTICK: NEGATIVE
Ketones, ur: 5 mg/dL — AB
LEUKOCYTES UA: NEGATIVE
Nitrite: NEGATIVE
PH: 6 (ref 5.0–8.0)
Protein, ur: NEGATIVE mg/dL
SPECIFIC GRAVITY, URINE: 1.02 (ref 1.005–1.030)

## 2018-04-06 LAB — LIPASE, BLOOD: Lipase: 66 U/L — ABNORMAL HIGH (ref 11–51)

## 2018-04-06 MED ORDER — DOCUSATE SODIUM 100 MG PO CAPS: 100.0000 mg | ORAL_CAPSULE | Freq: Two times a day (BID) | ORAL | 0 refills | Status: DC

## 2018-04-06 MED ORDER — ONDANSETRON HCL 4 MG PO TABS: 4.0000 mg | ORAL_TABLET | Freq: Four times a day (QID) | ORAL | 0 refills | Status: DC | PRN

## 2018-04-06 MED ORDER — DICYCLOMINE HCL 10 MG/ML IM SOLN
20.0000 mg | Freq: Once | INTRAMUSCULAR | Status: AC
  Administered 2018-04-06: 20 mg via INTRAMUSCULAR
  Filled 2018-04-06: qty 2

## 2018-04-06 MED ORDER — ONDANSETRON HCL 4 MG/2ML IJ SOLN
4.0000 mg | Freq: Once | INTRAMUSCULAR | Status: AC | PRN
  Administered 2018-04-06: 4 mg via INTRAVENOUS
  Filled 2018-04-06: qty 2

## 2018-04-06 MED ORDER — DICYCLOMINE HCL 20 MG PO TABS: 20.0000 mg | ORAL_TABLET | Freq: Two times a day (BID) | ORAL | 0 refills | Status: AC | PRN

## 2018-04-06 MED ORDER — DOCUSATE SODIUM 100 MG PO CAPS: 100.0000 mg | ORAL_CAPSULE | Freq: Two times a day (BID) | ORAL | 0 refills | Status: AC

## 2018-04-06 MED ORDER — POLYETHYLENE GLYCOL 3350 17 G PO PACK: 17.0000 g | PACK | Freq: Every day | ORAL | 0 refills | Status: AC

## 2018-04-06 MED ORDER — DICYCLOMINE HCL 20 MG PO TABS: 20.0000 mg | ORAL_TABLET | Freq: Two times a day (BID) | ORAL | 0 refills | Status: DC | PRN

## 2018-04-06 MED ORDER — ONDANSETRON HCL 4 MG PO TABS: 4.0000 mg | ORAL_TABLET | Freq: Four times a day (QID) | ORAL | 0 refills | Status: AC | PRN

## 2018-04-06 MED ORDER — KETOROLAC TROMETHAMINE 30 MG/ML IJ SOLN
30.0000 mg | Freq: Once | INTRAMUSCULAR | Status: AC
  Administered 2018-04-06: 30 mg via INTRAVENOUS
  Filled 2018-04-06: qty 1

## 2018-04-06 NOTE — ED Notes (Signed)
Pt returned from US, pain still a 8.

## 2018-04-06 NOTE — ED Provider Notes (Signed)
[name] Provider Note   CSN: [id number] Arrival date & time: 04/06/18  1733     History   Chief Complaint Chief Complaint  Patient presents with  . Abdominal Pain    HPI Alexander McRobert W Hence Jr. is a 52 y.o. male.  HPI Patient presents with abdominal pain that started yesterday.  States the pain is in the right upper quadrant radiates to his right flank.  [facility].  Had right upper quadrant ultrasound performed as well as CT abdomen and pelvis with contrast.  No acute findings.  Mild elevation of lipase.  Patient states pain worsened today.  Describes as sharp and episodic.  Has had some nausea with one episode of vomiting.  Has had some urinary hesitancy but denies hematuria or dysuria.  No shortness of breath or chest pain.  Patient is on Eliquis for previous PE.  Denies constipation.  Bowel movement this morning. Past Medical History:  Diagnosis Date  . DVT (deep venous thrombosis) (HCC)   . History of pulmonary embolus (PE)     Patient Active Problem List   Diagnosis Date Noted  . Primary hypercoagulable state (HCC) 05/21/2017  . Acute deep vein thrombosis (DVT) of distal vein of left lower extremity (HCC) 10/09/2016  . Pulmonary emboli (HCC) 06/27/2016    Past Surgical History:  Procedure Laterality Date  . NASAL FRACTURE SURGERY    . VASECTOMY          Home Medications    Prior to Admission medications   Medication Sig Start Date End Date Taking? Authorizing Provider  apixaban (ELIQUIS) 5 MG TABS tablet Take 5 mg by mouth 2 (two) times daily.     [provider]  dicyclomine (BENTYL) 20 MG tablet Take 1 tablet (20 mg total) by mouth 2 (two) times daily as needed for spasms. 04/06/18   Loren RacerYelverton, Zaynab Chipman, MD  docusate sodium (COLACE) 100 MG capsule Take 1 capsule (100 mg total) by mouth every 12 (twelve) hours. 04/06/18   Loren RacerYelverton, Sabriyah Wilcher, MD  HYDROcodone-acetaminophen  (NORCO) 5-325 MG tablet Take 1 tablet by mouth every 4 (four) hours as needed for moderate pain. 04/05/18   Phineas SemenGoodman, Graydon, MD  ondansetron (ZOFRAN) 4 MG tablet Take 1 tablet (4 mg total) by mouth every 6 (six) hours as needed for nausea or vomiting. 04/06/18   Loren RacerYelverton, Mercer Stallworth, MD  polyethylene glycol (MIRALAX / Ethelene HalGLYCOLAX) packet Take 17 g by mouth daily. 04/06/18   Loren RacerYelverton, Marieanne Marxen, MD  valACYclovir (VALTREX) 1000 MG tablet Take 1 tablet (1,000 mg total) by mouth 3 (three) times daily for 7 days. 04/05/18 04/12/18  Phineas SemenGoodman, Graydon, MD    Family History Family History  Problem Relation Age of Onset  . Heart attack Father   . Stroke Father   . Cancer Father [id number]       Prostate &/or bladder    Social History Social History   Tobacco Use  . Smoking status: Never Smoker  . Smokeless tobacco: Never Used  Substance Use Topics  . 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 Drug use: No     Allergies   Oxycodone-acetaminophen and Tramadol   Review of Systems Review of Systems  Constitutional: Negative for chills, fatigue and fever.  HENT: Negative for trouble swallowing.   Eyes: Negative for visual disturbance.  Respiratory: Negative for cough and shortness of breath.   Cardiovascular: Negative for chest pain, palpitations and  leg swelling.  Gastrointestinal: Positive for abdominal pain, nausea and vomiting. Negative for constipation and diarrhea.  Genitourinary: Positive for difficulty urinating and flank pain. Negative for dysuria, frequency, hematuria and testicular pain.  Musculoskeletal: Positive for back pain and myalgias. Negative for neck pain and neck stiffness.  Skin: Negative for rash and wound.  Neurological: Negative for dizziness, weakness, light-headedness, numbness and headaches.  Psychiatric/Behavioral: The patient is nervous/anxious.   All other systems reviewed and are negative.    Physical Exam Updated Vital Signs BP 139/80   Pulse 68    Temp 98.1 F (36.7 C) (Oral)   Resp 19   SpO2 95%   Physical Exam  Constitutional: He is oriented to person, place, and time. He appears well-developed and well-nourished. No distress.  HENT:  Head: Normocephalic and atraumatic.  Mouth/Throat: Oropharynx is clear and moist. No oropharyngeal exudate.  Eyes: Pupils are equal, round, and reactive to light. Conjunctivae and EOM are normal.  Neck: Normal range of motion. Neck supple. No JVD present. No tracheal deviation present. No thyromegaly present.  Cardiovascular: Normal rate and regular rhythm. Exam reveals no gallop and no friction rub.  No murmur heard. Pulmonary/Chest: Effort normal and breath sounds normal. No stridor. No respiratory distress. He has no wheezes. He has no rales. He exhibits no tenderness.  Abdominal: Soft. Bowel sounds are normal. There is tenderness. There is no rebound and no guarding.  Right upper quadrant tenderness to palpation.  No rebound or guarding.  Musculoskeletal: Normal range of motion. He exhibits no edema or tenderness.  Mild right CVA tenderness to percussion.  No lower extremity swelling, asymmetry or tenderness.  Lymphadenopathy:    He has no cervical adenopathy.  Neurological: He is alert and oriented to person, place, and time.  Moves all extremities without focal deficit.  Sensation intact.  Skin: Skin is warm and dry. No rash noted. He is not diaphoretic. No erythema.  Psychiatric: He has a normal mood and affect. His behavior is normal.  Nursing note and vitals reviewed.    ED Treatments / Results  Labs (all labs ordered are listed, but only abnormal results are displayed) Labs Reviewed  LIPASE, BLOOD - Abnormal; Notable for the following components:      Result Value   Lipase 66 (*)    All other components within normal limits  COMPREHENSIVE METABOLIC PANEL - Abnormal; Notable for the following components:   Glucose, Bld 148 (*)    All other components within normal limits  CBC -  Abnormal; Notable for the following components:   WBC 10.9 (*)    All other components within normal limits  URINALYSIS, ROUTINE W REFLEX MICROSCOPIC - Abnormal; Notable for the following components:   Ketones, ur 5 (*)    All other components within normal limits    EKG None  Radiology Dg Chest 2 View  Result Date: 04/05/2018 CLINICAL DATA:  Lambert Mody right-sided rib pain. History of pulmonary embolus. EXAM: CHEST - 2 VIEW COMPARISON:  July 06, 2016 FINDINGS: The heart size and mediastinal contours are within normal limits. Both lungs are clear. The visualized skeletal structures are unremarkable. IMPRESSION: No active cardiopulmonary disease. Electronically Signed   By: Gerome Sam III M.D   On: 04/05/2018 17:50   Ct Abdomen Pelvis W Contrast  Result Date: 04/05/2018 CLINICAL DATA:  Right-sided abdominal pain EXAM: CT ABDOMEN AND PELVIS WITH CONTRAST TECHNIQUE: Multidetector CT imaging of the abdomen and pelvis was performed using the standard protocol following bolus administration of intravenous  contrast. CONTRAST:  ISOVUE-300 IOPAMIDOL (ISOVUE-300) INJECTION 61% COMPARISON:  CT 11/05/2010, ultrasound 04/05/2018 FINDINGS: Lower chest: Lung bases demonstrate no acute consolidation or effusion. The heart size is within normal limits Hepatobiliary: No focal liver abnormality is seen. No gallstones, gallbladder wall thickening, or biliary dilatation. Pancreas: Unremarkable. No pancreatic ductal dilatation or surrounding inflammatory changes. Spleen: Normal in size without focal abnormality. Adrenals/Urinary Tract: Adrenal glands are unremarkable. Kidneys are normal, without renal calculi, focal lesion, or hydronephrosis. Bladder is unremarkable. Stomach/Bowel: Stomach is within normal limits. Appendix appears normal. No evidence of bowel wall thickening, distention, or inflammatory changes. Large stool in the cecum. Sigmoid colon diverticular disease without acute inflammation  Vascular/Lymphatic: No significant vascular findings are present. No enlarged abdominal or pelvic lymph nodes. Reproductive: Enlarged prostate with calcification Other: Negative for free air or free fluid. Musculoskeletal: No acute or significant osseous findings. IMPRESSION: 1. No CT evidence for acute intra-abdominal or pelvic abnormality. 2. Sigmoid colon diverticular disease without acute inflammation 3. Prostatomegaly Electronically Signed   By: Jasmine Pang M.D.   On: 04/05/2018 22:15   US Renal  Result Date: 04/06/2018 CLINICAL DATA:  Right flank pain EXAM: RENAL / URINARY TRACT ULTRASOUND COMPLETE COMPARISON:  None. FINDINGS: Right Kidney: Length: 9.8 cm. Echogenicity within normal limits. No mass or hydronephrosis visualized. Left Kidney: Length: 11.3 cm. Echogenicity within normal limits. No mass or hydronephrosis visualized. Bladder: Appears normal for degree of bladder distention. IMPRESSION: No acute findings.  No hydronephrosis. Electronically Signed   By: Charlett Nose M.D.   On: 04/06/2018 20:06   US Abdomen Limited Ruq  Result Date: 04/05/2018 CLINICAL DATA:  Abdominal pain EXAM: ULTRASOUND ABDOMEN LIMITED RIGHT UPPER QUADRANT COMPARISON:  04/14/2013 FINDINGS: Gallbladder: No gallstones or wall thickening visualized. No sonographic Murphy sign noted by sonographer. Common bile duct: Diameter: 5 mm in diameter Liver: No focal lesion identified. Within normal limits in parenchymal echogenicity. Portal vein is patent on color Doppler imaging with normal direction of blood flow towards the liver. IMPRESSION: Normal right upper quadrant ultrasound. Electronically Signed   By: Charlett Nose M.D.   On: 04/05/2018 20:32    Procedures Procedures (including critical care time)  Medications Ordered in ED Medications  ondansetron (ZOFRAN) injection 4 mg (4 mg Intravenous Given 04/06/18 1847)  dicyclomine (BENTYL) injection 20 mg (20 mg Intramuscular Given 04/06/18 1932)  ketorolac (TORADOL) 30  MG/ML injection 30 mg (30 mg Intravenous Given 04/06/18 1931)     Initial Impression / Assessment and Plan / ED Course  I have reviewed the triage vital signs and the nursing notes.  Pertinent labs & imaging results that were available during my care of the patient were reviewed by me and considered in my medical decision making (see chart for details).     Patient is very comfortable appearing.  Repeatedly asking for pain medication.  Abdominal exam is benign.  Review of patient's CT abdomen pelvis, right upper quadrant all pulse ultrasound and labs from yesterday.  Patient was noted to have a large stool ball in the cecum on CT yesterday.  This consistent with where the patient is complaining of pain.  Renal ultrasound performed with no acute findings.  Will start on bowel regimen and advise close follow-up with gastroneurology should his symptoms persist.  Return precautions given.  Final Clinical Impressions(s) / ED Diagnoses   Final diagnoses:  Right upper quadrant abdominal pain  Constipation, unspecified constipation type    ED Discharge Orders        Ordered  dicyclomine (BENTYL) 20 MG tablet  2 times daily PRN,   Status:  Discontinued     04/06/18 2039    docusate sodium (COLACE) 100 MG capsule  Every 12 hours,   Status:  Discontinued     04/06/18 2039    polyethylene glycol (MIRALAX / GLYCOLAX) packet  Daily     04/06/18 2039    ondansetron (ZOFRAN) 4 MG tablet  Every 6 hours PRN,   Status:  Discontinued     04/06/18 2039    dicyclomine (BENTYL) 20 MG tablet  2 times daily PRN     04/06/18 2041    docusate sodium (COLACE) 100 MG capsule  Every 12 hours     04/06/18 2041    ondansetron (ZOFRAN) 4 MG tablet  Every 6 hours PRN     04/06/18 2041       Loren Racer, MD 04/06/18 2043

## 2018-04-06 NOTE — ED Notes (Signed)
States pain is worse now and burning.

## 2018-04-06 NOTE — ED Triage Notes (Signed)
Pt returned to ED w/ right sided pain under his ribs.  Pt is not able to control pain at this time.  Pt is unable to sit still due to pain, Pt is unable to eat anything, nausea.  Pt alert.

## 2018-04-06 NOTE — ED Notes (Signed)
Dr Yelverton at bedside.  

## 2018-04-06 NOTE — ED Notes (Signed)
Dr. Ranae PalmsYelverton notified of pt and pain.

## 2018-04-08 ENCOUNTER — Ambulatory Visit
Admission: RE | Admit: 2018-04-08 | Discharge: 2018-04-08 | Disposition: A | Discharge status: Home / Self Care | Payer: 59 | Source: Ambulatory Visit | Attending: Gastroenterology | Admitting: Gastroenterology

## 2018-04-08 ENCOUNTER — Other Ambulatory Visit: Payer: Self-pay

## 2018-04-08 ENCOUNTER — Emergency Department: Payer: 59

## 2018-04-08 ENCOUNTER — Inpatient Hospital Stay
Admission: EM | Admit: 2018-04-08 | Discharge: 2018-04-11 | DRG: 644 | Disposition: A | Discharge status: Home / Self Care | Payer: 59 | Attending: Specialist | Admitting: Specialist

## 2018-04-08 ENCOUNTER — Other Ambulatory Visit: Payer: Self-pay | Admitting: Gastroenterology

## 2018-04-08 DIAGNOSIS — I10 Essential (primary) hypertension: Secondary | ICD-10-CM | POA: Diagnosis present

## 2018-04-08 DIAGNOSIS — E860 Dehydration: Secondary | ICD-10-CM | POA: Diagnosis present

## 2018-04-08 DIAGNOSIS — K567 Ileus, unspecified: Secondary | ICD-10-CM | POA: Diagnosis not present

## 2018-04-08 DIAGNOSIS — Z7901 Long term (current) use of anticoagulants: Secondary | ICD-10-CM

## 2018-04-08 DIAGNOSIS — D6859 Other primary thrombophilia: Secondary | ICD-10-CM

## 2018-04-08 DIAGNOSIS — E2749 Other adrenocortical insufficiency: Secondary | ICD-10-CM | POA: Diagnosis not present

## 2018-04-08 DIAGNOSIS — Z885 Allergy status to narcotic agent status: Secondary | ICD-10-CM

## 2018-04-08 DIAGNOSIS — I82509 Chronic embolism and thrombosis of unspecified deep veins of unspecified lower extremity: Secondary | ICD-10-CM | POA: Diagnosis not present

## 2018-04-08 DIAGNOSIS — R1031 Right lower quadrant pain: Secondary | ICD-10-CM

## 2018-04-08 DIAGNOSIS — Z79899 Other long term (current) drug therapy: Secondary | ICD-10-CM

## 2018-04-08 DIAGNOSIS — R1011 Right upper quadrant pain: Secondary | ICD-10-CM

## 2018-04-08 DIAGNOSIS — I712 Thoracic aortic aneurysm, without rupture: Secondary | ICD-10-CM | POA: Diagnosis not present

## 2018-04-08 DIAGNOSIS — I2782 Chronic pulmonary embolism: Secondary | ICD-10-CM | POA: Diagnosis not present

## 2018-04-08 DIAGNOSIS — I82409 Acute embolism and thrombosis of unspecified deep veins of unspecified lower extremity: Secondary | ICD-10-CM | POA: Diagnosis not present

## 2018-04-08 DIAGNOSIS — D6861 Antiphospholipid syndrome: Secondary | ICD-10-CM | POA: Diagnosis not present

## 2018-04-08 DIAGNOSIS — K5900 Constipation, unspecified: Secondary | ICD-10-CM | POA: Diagnosis not present

## 2018-04-08 DIAGNOSIS — Z86718 Personal history of other venous thrombosis and embolism: Secondary | ICD-10-CM

## 2018-04-08 DIAGNOSIS — R109 Unspecified abdominal pain: Secondary | ICD-10-CM | POA: Diagnosis not present

## 2018-04-08 DIAGNOSIS — Z86711 Personal history of pulmonary embolism: Secondary | ICD-10-CM | POA: Diagnosis not present

## 2018-04-08 DIAGNOSIS — R197 Diarrhea, unspecified: Secondary | ICD-10-CM | POA: Diagnosis not present

## 2018-04-08 DIAGNOSIS — Z809 Family history of malignant neoplasm, unspecified: Secondary | ICD-10-CM | POA: Diagnosis not present

## 2018-04-08 DIAGNOSIS — I2609 Other pulmonary embolism with acute cor pulmonale: Secondary | ICD-10-CM | POA: Diagnosis not present

## 2018-04-08 LAB — COMPREHENSIVE METABOLIC PANEL
ALBUMIN: 5 g/dL (ref 3.5–5.0)
ALK PHOS: 66 U/L (ref 38–126)
ALT: 22 U/L (ref 0–44)
ANION GAP: 7 (ref 5–15)
AST: 34 U/L (ref 15–41)
BUN: 20 mg/dL (ref 6–20)
CALCIUM: 9.5 mg/dL (ref 8.9–10.3)
CHLORIDE: 101 mmol/L (ref 98–111)
CO2: 29 mmol/L (ref 22–32)
Creatinine, Ser: 1.16 mg/dL (ref 0.61–1.24)
GFR calc Af Amer: >60 mL/min (ref >60)
GFR calc non Af Amer: >60 mL/min (ref >60)
GLUCOSE: 107 mg/dL — AB (ref 70–99)
Potassium: 4.5 mmol/L (ref 3.5–5.1)
Sodium: 137 mmol/L (ref 135–145)
Total Bilirubin: 1.3 mg/dL — ABNORMAL HIGH (ref 0.3–1.2)
Total Protein: 8.5 g/dL — ABNORMAL HIGH (ref 6.5–8.1)

## 2018-04-08 LAB — URINALYSIS, COMPLETE (UACMP) WITH MICROSCOPIC
BACTERIA UA: NONE SEEN
Bilirubin Urine: NEGATIVE
Glucose, UA: NEGATIVE mg/dL
Ketones, ur: 20 mg/dL — AB
Leukocytes, UA: NEGATIVE
Nitrite: NEGATIVE
PROTEIN: NEGATIVE mg/dL
Specific Gravity, Urine: >1.046 — ABNORMAL HIGH (ref 1.005–1.030)
pH: 6 (ref 5.0–8.0)

## 2018-04-08 LAB — CBC
HEMATOCRIT: 46.2 % (ref 40.0–52.0)
HEMOGLOBIN: 16.1 g/dL (ref 13.0–18.0)
MCH: 33 pg (ref 26.0–34.0)
MCHC: 34.9 g/dL (ref 32.0–36.0)
MCV: 94.6 fL (ref 80.0–100.0)
Platelets: 165 10*3/uL (ref 150–440)
RBC: 4.89 MIL/uL (ref 4.40–5.90)
RDW: 12.9 % (ref 11.5–14.5)
WBC: 12.3 10*3/uL — ABNORMAL HIGH (ref 3.8–10.6)

## 2018-04-08 LAB — LIPASE, BLOOD: LIPASE: 52 U/L — AB (ref 11–51)

## 2018-04-08 MED ORDER — ONDANSETRON HCL 4 MG/2ML IJ SOLN: 4.0000 mg | Freq: Four times a day (QID) | INTRAMUSCULAR | Status: DC | PRN

## 2018-04-08 MED ORDER — MORPHINE SULFATE (PF) 4 MG/ML IV SOLN
3.0000 mg | INTRAVENOUS | Status: DC | PRN
  Administered 2018-04-08 – 2018-04-11 (×14): 3 mg via INTRAVENOUS
  Filled 2018-04-08 (×14): qty 1

## 2018-04-08 MED ORDER — DOCUSATE SODIUM 100 MG PO CAPS
100.0000 mg | ORAL_CAPSULE | Freq: Two times a day (BID) | ORAL | Status: DC
  Administered 2018-04-08 – 2018-04-10 (×4): 100 mg via ORAL
  Filled 2018-04-08 (×4): qty 1

## 2018-04-08 MED ORDER — SODIUM CHLORIDE 0.9 % IV SOLN
Freq: Once | INTRAVENOUS | Status: AC
  Administered 2018-04-08: 17:00:00 via INTRAVENOUS

## 2018-04-08 MED ORDER — IOPAMIDOL (ISOVUE-370) INJECTION 76%
100.0000 mL | Freq: Once | INTRAVENOUS | Status: AC | PRN
  Administered 2018-04-08: 100 mL via INTRAVENOUS

## 2018-04-08 MED ORDER — ONDANSETRON HCL 4 MG PO TABS: 4.0000 mg | ORAL_TABLET | Freq: Four times a day (QID) | ORAL | Status: DC | PRN

## 2018-04-08 MED ORDER — VALACYCLOVIR HCL 500 MG PO TABS
1000.0000 mg | ORAL_TABLET | Freq: Three times a day (TID) | ORAL | Status: DC
  Administered 2018-04-08 – 2018-04-09 (×2): 1000 mg via ORAL
  Filled 2018-04-08 (×4): qty 2

## 2018-04-08 MED ORDER — MORPHINE SULFATE (PF) 4 MG/ML IV SOLN
4.0000 mg | Freq: Once | INTRAVENOUS | Status: AC
  Administered 2018-04-08: 4 mg via INTRAVENOUS

## 2018-04-08 MED ORDER — KETOROLAC TROMETHAMINE 30 MG/ML IJ SOLN
30.0000 mg | Freq: Once | INTRAMUSCULAR | Status: AC
  Administered 2018-04-08: 30 mg via INTRAVENOUS
  Filled 2018-04-08: qty 1

## 2018-04-08 MED ORDER — ACETAMINOPHEN 650 MG RE SUPP: 650.0000 mg | Freq: Four times a day (QID) | RECTAL | Status: DC | PRN

## 2018-04-08 MED ORDER — ACETAMINOPHEN 325 MG PO TABS: 650.0000 mg | ORAL_TABLET | Freq: Four times a day (QID) | ORAL | Status: DC | PRN

## 2018-04-08 MED ORDER — HYDROCODONE-ACETAMINOPHEN 5-325 MG PO TABS
1.0000 | ORAL_TABLET | ORAL | Status: DC | PRN
  Filled 2018-04-08: qty 2

## 2018-04-08 MED ORDER — MORPHINE SULFATE (PF) 4 MG/ML IV SOLN
INTRAVENOUS | Status: AC
  Administered 2018-04-08: 4 mg via INTRAVENOUS
  Filled 2018-04-08: qty 1

## 2018-04-08 MED ORDER — DEXTROSE-NACL 5-0.45 % IV SOLN
INTRAVENOUS | Status: DC
  Administered 2018-04-08 – 2018-04-10 (×4): via INTRAVENOUS

## 2018-04-08 NOTE — ED Triage Notes (Addendum)
Pt was seen in ED here for abd pain Saturday dx unspecific - [facility] and dx with "blockage in colon" and sent home with laxative - pt went to PCP today and [facility] abd pain and possible ilieus obstruction - he was hemo occult negative - pt last BM Saturday and not passing gas

## 2018-04-08 NOTE — H&P (Signed)
[employer] [facility] [facility] [place] at [facility]   PATIENT NAME: Alexander [name]    MR#:  [id number]  DATE OF BIRTH:  11/22/1965  DATE OF ADMISSION:  04/08/2018  PRIMARY CARE PHYSICIAN: Marisue Ivan, MD   REQUESTING/REFERRING PHYSICIAN:   CHIEF COMPLAINT:   Chief Complaint  Patient presents with  . Abdominal Pain    HISTORY OF PRESENT ILLNESS: Jemarion Roycroft  is a 52 y.o. 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 Donneta Romberg, presents with acute abdominal pain since Saturday/3 days, patient was seen at [facility] in [place] was thought to have constipation and was sent home with laxatives, seen by primary care provider today with continued abdominal pain and inability to have a bowel movement, patient subsequently sent to gastroenterology-gastroenterology subsequently sent the patient to the ER for further evaluation, ER work-up noted for acute enteritis versus diarrhea type picture on study, CT abdomen subsequently done noted for right adrenal gland hemorrhage with reactive acute ileus, urinalysis noted for severe dehydration/ketones, white count 12,000, total bili 1.3, lipase 52, patient evaluated in the emergency room, found to be in mild distress due to pain, patient now been admitted for acute reactive ileus from acute adrenal gland hemorrhage.  PAST MEDICAL HISTORY:   Past Medical History:  Diagnosis Date  . DVT (deep venous thrombosis) (HCC)   . History of pulmonary embolus (PE)     PAST SURGICAL HISTORY:  Past Surgical History:  Procedure Laterality Date  . NASAL FRACTURE SURGERY    . VASECTOMY      SOCIAL HISTORY:  Social History   Tobacco Use  . Smoking status: Never Smoker  . Smokeless tobacco: Never Used  Substance Use Topics  . 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 Heart attack Father   .  Stroke Father   . Cancer Father 105       Prostate &/or bladder    DRUG ALLERGIES:  Allergies  Allergen Reactions  . Oxycodone-Acetaminophen Nausea Only  . Tramadol Other (See Comments)    Causes depression    REVIEW OF SYSTEMS:   CONSTITUTIONAL: No fever, fatigue or weakness.  EYES: No blurred or double vision.  EARS, NOSE, AND THROAT: No tinnitus or ear pain.  RESPIRATORY: No cough, shortness of breath, wheezing or hemoptysis.  CARDIOVASCULAR: No chest pain, orthopnea, edema.  GASTROINTESTINAL: + nausea, vomiting, abdominal pain, constipation.  GENITOURINARY: No dysuria, hematuria.  ENDOCRINE: No polyuria, nocturia,  HEMATOLOGY: No anemia, easy bruising or bleeding SKIN: No rash or lesion. MUSCULOSKELETAL: No joint pain or arthritis.   NEUROLOGIC: No tingling, numbness, weakness.  PSYCHIATRY: No anxiety or depression.   MEDICATIONS AT HOME:  Prior to Admission medications   Medication Sig Start Date End Date Taking? Authorizing Provider  apixaban (ELIQUIS) 5 MG TABS tablet Take 5 mg by mouth 2 (two) times daily.    Yes [provider]  dicyclomine (BENTYL) 20 MG tablet Take 1 tablet (20 mg total) by mouth 2 (two) times daily as needed for spasms. 04/06/18  Yes Loren Racer, MD  docusate sodium (COLACE) 100 MG capsule Take 1 capsule (100 mg total) by mouth every 12 (twelve) hours. 04/06/18  Yes Loren Racer, MD  HYDROcodone-acetaminophen (NORCO) 5-325 MG tablet Take 1 tablet by mouth every 4 (four) hours as needed for moderate pain. 04/05/18  Yes Phineas Semen,  MD  ondansetron (ZOFRAN) 4 MG tablet Take 1 tablet (4 mg total) by mouth every 6 (six) hours as needed for nausea or vomiting. 04/06/18  Yes Loren Racer, MD  polyethylene glycol (MIRALAX / GLYCOLAX) packet Take 17 g by mouth daily. Patient taking differently: Take 17 g by mouth daily as needed for mild constipation.  04/06/18  Yes Loren Racer, MD  valACYclovir (VALTREX) 1000 MG tablet Take 1  tablet (1,000 mg total) by mouth 3 (three) times daily for 7 days. 04/05/18 04/12/18 Yes Phineas Semen, MD      PHYSICAL EXAMINATION:   VITAL SIGNS: Blood pressure (!) 182/94, pulse 88, temperature 98.1 F (36.7 C), temperature source Oral, resp. rate 16, height 5\' 10"  (1.778 m), weight 96.6 kg (213 lb), SpO2 93 %.  GENERAL:  52 y.o.-year-old patient lying in the bed with no acute distress.  EYES: Pupils equal, round, reactive to light and accommodation. No scleral icterus. Extraocular muscles intact.  HEENT: Head atraumatic, normocephalic. Oropharynx and nasopharynx clear.  NECK:  Supple, no jugular venous distention. No thyroid enlargement, no tenderness.  LUNGS: Normal breath sounds bilaterally, no wheezing, rales,rhonchi or crepitation. No use of accessory muscles of respiration.  CARDIOVASCULAR: S1, S2 normal. No murmurs, rubs, or gallops.  ABDOMEN: Soft, mild abdominal tenderness without guarding/distention, nondistended. Bowel sounds present. No organomegaly or mass.  EXTREMITIES: No pedal edema, cyanosis, or clubbing.  NEUROLOGIC: Cranial nerves II through XII are intact. Muscle strength 5/5 in all extremities. Sensation intact. Gait not checked.  PSYCHIATRIC: The patient is alert and oriented x 3.  SKIN: No obvious rash, lesion, or ulcer.   LABORATORY PANEL:   CBC Recent Labs  Lab 04/05/18 1726 04/06/18 1756 04/08/18 1332  WBC 5.9 10.9* 12.3*  HGB 14.6 15.0 16.1  HCT 41.8 44.8 46.2  PLT 164 167 165  MCV 95.1 95.3 94.6  MCH 33.2 31.9 33.0  MCHC 34.9 33.5 34.9  RDW 13.6 13.0 12.9   ------------------------------------------------------------------------------------------------------------------  Chemistries  Recent Labs  Lab 04/05/18 1726 04/06/18 1756 04/08/18 1332  NA 142 139 137  K 3.8 3.6 4.5  CL 107 105 101  CO2 28 24 29   GLUCOSE 122* 148* 107*  BUN 20 11 20   CREATININE 1.40* 1.15 1.16  CALCIUM 9.3 9.5 9.5  AST 28 22 34  ALT 26 24 22   ALKPHOS 48 61  66  BILITOT 0.7 0.9 1.3*   ------------------------------------------------------------------------------------------------------------------ estimated creatinine clearance is 88.8 mL/min (by C-G formula based on SCr of 1.16 mg/dL). ------------------------------------------------------------------------------------------------------------------ No results for input(s): TSH, T4TOTAL, T3FREE, THYROIDAB in the last 72 hours.  Invalid input(s): FREET3   Coagulation profile No results for input(s): INR, PROTIME in the last 168 hours. ------------------------------------------------------------------------------------------------------------------- No results for input(s): DDIMER in the last 72 hours. -------------------------------------------------------------------------------------------------------------------  Cardiac Enzymes Recent Labs  Lab 04/05/18 1726  TROPONINI 0.03*   ------------------------------------------------------------------------------------------------------------------ Invalid input(s): POCBNP  ---------------------------------------------------------------------------------------------------------------  Urinalysis    Component Value Date/Time   COLORURINE YELLOW (A) 04/08/2018 1825   APPEARANCEUR CLEAR (A) 04/08/2018 1825   LABSPEC >1.046 (H) 04/08/2018 1825   PHURINE 6.0 04/08/2018 1825   GLUCOSEU NEGATIVE 04/08/2018 1825   HGBUR SMALL (A) 04/08/2018 1825   BILIRUBINUR NEGATIVE 04/08/2018 1825   KETONESUR 20 (A) 04/08/2018 1825   PROTEINUR NEGATIVE 04/08/2018 1825   NITRITE NEGATIVE 04/08/2018 1825   LEUKOCYTESUR NEGATIVE 04/08/2018 1825     RADIOLOGY: Ct Abdomen Pelvis W Contrast  Result Date: 04/08/2018 CLINICAL DATA:  Generalized abdominal pain with possible ileus or obstruction. Last bowel movement  3 days ago. Patient not passing gas. EXAM: CT ABDOMEN AND PELVIS WITH CONTRAST TECHNIQUE: Multidetector CT imaging of the abdomen and pelvis was  performed using the standard protocol following bolus administration of intravenous contrast. CONTRAST:  100mL ISOVUE-370 IOPAMIDOL (ISOVUE-370) INJECTION 76% COMPARISON:  KUB April 08, 2018.  CT scan April 05, 2018. FINDINGS: Lower chest: Mild atelectasis in the right lung. No other abnormalities. Hepatobiliary: Hepatic steatosis. No focal liver lesions or masses. The gallbladder is normal. Portal vein is patent. Pancreas: Unremarkable. No pancreatic ductal dilatation or surrounding inflammatory changes. Spleen: Normal in size without focal abnormality. Adrenals/Urinary Tract: The left adrenal gland is normal. The right adrenal gland has enlarged in the interval with marked adjacent fat stranding. The attenuation of the right adrenal gland is 61 Hounsfield units. It is now rounded measuring 4.0 by 3.0 by 4.8 cm. Kidneys are normal in appearance with no hydronephrosis. The ureters and bladder are normal. Stomach/Bowel: The stomach is normal without distention. There are several clustered loops of mildly dilated jejunum in the left upper quadrant measuring up to 3.3 cm. The remainder of the small bowel is normal in caliber. Scattered colonic diverticuli are noted. No diverticulitis. There is fluid throughout most of the colon with air-fluid levels. No wall thickening. The appendix is normal. Vascular/Lymphatic: The abdominal aorta is normal in caliber without aneurysm or dissection. No atherosclerotic change. No adenopathy. Reproductive: Prostate calcifications. Other: No free air. There is a fat containing periumbilical hernia. There is a small amount of free fluid in the right upper quadrant with no fluid collection. Musculoskeletal: No acute or significant osseous findings. IMPRESSION: 1. The right adrenal gland is now rounded and masslike with an attenuation of 60 Hounsfield units, new in the interval. There is significant adjacent fat stranding. The findings are most consistent with an adrenal hemorrhage of  uncertain etiology. This is likely the cause of the patient's right-sided pain. 2. There are a few adjacent mildly dilated loops of jejunum in the left upper quadrant. The remainder of the small bowel loops are normal. The colon is fluid-filled along much of its length. This constellation of findings most likely represents ileus or sequela of enteritis with diarrhea. Recommend clinical correlation and close follow-up. 3. No other abnormalities. Electronically Signed   By: Gerome Samavid  Williams III M.D   On: 04/08/2018 17:50   Dg Abd 2 Views  Result Date: 04/08/2018 CLINICAL DATA:  Right side abdominal pain since 04/05/2018. EXAM: ABDOMEN - 2 VIEW COMPARISON:  CT abdomen and pelvis scratch the CT chest, CT abdomen and pelvis 04/05/2018. FINDINGS: No free intraperitoneal air is identified. Air-fluid levels are seen in both small and large bowel. The bowel gas pattern is not typical of obstruction. No abnormal abdominal calcification is identified. No acute or focal bony abnormality. IMPRESSION: Nonobstructive bowel gas pattern with air-fluid levels in both small and large bowel suggestive of enteritis and diarrhea. Electronically Signed   By: Drusilla Kannerhomas  Dalessio M.D.   On: 04/08/2018 12:03    EKG: Orders placed or performed in visit on 04/06/18  . 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 Brahmanday/oncology-will reconsult as patient is thought to have genetic predisposition for  clot development Hold Eliquis  *Acute hypertension Exacerbated by pain Hydralazine PRN, adult pain protocol per above  *Acute dehydration IV fluids for rehydration   All the records are reviewed and case discussed with ED provider. Management plans discussed with the patient, family and they are in agreement.  CODE STATUS:full Code Status History    Date Active Date Inactive Code Status Order ID Comments User Context   06/27/2016 2232 06/29/2016 1711 Full Code [id number]  Shaune Pollack, MD ED       TOTAL TIME TAKING CARE OF THIS PATIENT: 45 minutes.    Evelena Asa Delisia Mcquiston M.D on 04/08/2018   Between 7am to 6pm - Pager - [phone]  After 6pm go to [email or web address] - password [email or web address]  [facility]  Office  [phone]  CC: Primary care physician; Marisue Ivan, MD   Note: This dictation was prepared with Dragon dictation along with smaller phrase technology. Any transcriptional errors that result from this process are unintentional.

## 2018-04-08 NOTE — ED Provider Notes (Signed)
[facility] Emergency Department Provider Note       Time seen: ----------------------------------------- 5:15 PM on 04/08/2018 -----------------------------------------   I have reviewed the triage vital signs and the nursing notes.  HISTORY   Chief Complaint Abdominal Pain    HPI Alexander McRobert W Koebel Jr. is a 52 y.o. male with a history of DVT and PE who presents to the ED for abdominal pain that has been intermittent.  Patient was seen here for abdominal pain on Saturday without a specific diagnosis being given.  He was then seen at [facility] on Sunday and was diagnosed with "blockage in his colon".  Patient was sent home with a laxative however, went to his primary care doctor today who sent him to GI.  The GI doctor sent him to the ER for evaluation for his persistent abdominal pain and possible ileus.  Patient reports he was Hemoccult negative at the GI doctor's office today, last bowel movement was Saturday although he is passed a very scant amount while waiting to be seen today.  He still not passing gas well.  Past Medical History:  Diagnosis Date  . DVT (deep venous thrombosis) (HCC)   . History of pulmonary embolus (PE)     Patient Active Problem List   Diagnosis Date Noted  . Primary hypercoagulable state (HCC) 05/21/2017  . Acute deep vein thrombosis (DVT) of distal vein of left lower extremity (HCC) 10/09/2016  . Pulmonary emboli (HCC) 06/27/2016    Past Surgical History:  Procedure Laterality Date  . NASAL FRACTURE SURGERY    . VASECTOMY      Allergies Oxycodone-acetaminophen and Tramadol  Social History Social History   Tobacco Use  . Smoking status: Never Smoker  . Smokeless tobacco: Never Used  Substance Use Topics  . 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 Drug use: No   Review of Systems Constitutional: Negative for fever. Cardiovascular: Negative for chest pain. Respiratory: Negative for shortness  of breath. Gastrointestinal: Positive for abdominal pain, constipation Musculoskeletal: Negative for back pain. Skin: Negative for rash. Neurological: Negative for headaches, focal weakness or numbness.  All systems negative/normal/unremarkable except as stated in the HPI  ____________________________________________   PHYSICAL EXAM:  VITAL SIGNS: ED Triage Vitals  Enc Vitals Group     BP 04/08/18 1329 (!) 132/93     Pulse Rate 04/08/18 1329 97     Resp 04/08/18 1329 15     Temp 04/08/18 1329 98.1 F (36.7 C)     Temp Source 04/08/18 1329 Oral     SpO2 04/08/18 1329 99 %     Weight 04/08/18 1330 213 lb (96.6 kg)     Height 04/08/18 1330 5\' 10"  (1.778 m)     Head Circumference --      Peak Flow --      Pain Score 04/08/18 1329 0     Pain Loc --      Pain Edu? --      Excl. in GC? --    Constitutional: Alert and oriented. Well appearing and in no distress. Eyes: Conjunctivae are normal. Normal extraocular movements. Cardiovascular: Normal rate, regular rhythm. No murmurs, rubs, or gallops. Respiratory: Normal respiratory effort without tachypnea nor retractions. Breath sounds are clear and equal bilaterally. No wheezes/rales/rhonchi. Gastrointestinal: Soft with nonfocal right-sided tenderness, normal bowel sounds. Musculoskeletal: Nontender with normal range of motion in extremities. No lower extremity tenderness nor edema. Neurologic:  Normal speech and language. No gross  focal neurologic deficits are appreciated.  Skin:  Skin is warm, dry and intact. No rash noted. Psychiatric: Mood and affect are normal. Speech and behavior are normal.  ____________________________________________  ED COURSE:  As part of my medical decision making, I reviewed the following data within the electronic MEDICAL RECORD NUMBER History obtained from family if available, nursing notes, old chart and ekg, as well as notes from prior ED visits. Patient presented for abdominal pain, we will assess with  labs and imaging as indicated at this time.   Procedures ____________________________________________   LABS (pertinent positives/negatives)  Labs Reviewed  LIPASE, BLOOD - Abnormal; Notable for the following components:      Result Value   Lipase 52 (*)    All other components within normal limits  COMPREHENSIVE METABOLIC PANEL - Abnormal; Notable for the following components:   Glucose, Bld 107 (*)    Total Protein 8.5 (*)    Total Bilirubin 1.3 (*)    All other components within normal limits  CBC - Abnormal; Notable for the following components:   WBC 12.3 (*)    All other components within normal limits  URINALYSIS, COMPLETE (UACMP) WITH MICROSCOPIC    RADIOLOGY Images were viewed by me  CT the abdomen pelvis with contrast IMPRESSION: 1. The right adrenal gland is now rounded and masslike with an attenuation of 60 Hounsfield units, new in the interval. There is significant adjacent fat stranding. The findings are most consistent with an adrenal hemorrhage of uncertain etiology. This is likely the cause of the patient's right-sided pain. 2. There are a few adjacent mildly dilated loops of jejunum in the left upper quadrant. The remainder of the small bowel loops are normal. The colon is fluid-filled along much of its length. This constellation of findings most likely represents ileus or sequela of enteritis with diarrhea. Recommend clinical correlation and close follow-up. 3. No other abnormalities. ____________________________________________  DIFFERENTIAL DIAGNOSIS   Ileus, constipation, gas pain, obstruction  FINAL ASSESSMENT AND PLAN  Adrenal hemorrhage, ileus   Plan: The patient had presented for persistent abdominal pain and possible ileus, this is his third ER visit in 4 days. Patient's labs revealed a mildly increasing white blood cell count. Patient's imaging did reveal a unilateral adrenal hemorrhage likely from his Eliquis which does not appear to be  posttraumatic as he cannot remember any traumatic event.  He likely has an ileus secondary to this hemorrhage.  Currently his pain is improving after morphine.  I have discussed with general surgery and urology who recommend observation with repeat imaging.   Ulice Dash, MD   Note: This note was generated in part or whole with voice recognition software. Voice recognition is usually quite accurate but there are transcription errors that can and very often do occur. I apologize for any typographical errors that were not detected and corrected.     Emily Filbert, MD 04/08/18 [phone]

## 2018-04-08 NOTE — ED Notes (Signed)
Patient transported to CT 

## 2018-04-09 ENCOUNTER — Inpatient Hospital Stay: Payer: 59

## 2018-04-09 DIAGNOSIS — I2609 Other pulmonary embolism with acute cor pulmonale: Secondary | ICD-10-CM

## 2018-04-09 DIAGNOSIS — E2749 Other adrenocortical insufficiency: Principal | ICD-10-CM

## 2018-04-09 DIAGNOSIS — I82509 Chronic embolism and thrombosis of unspecified deep veins of unspecified lower extremity: Secondary | ICD-10-CM

## 2018-04-09 DIAGNOSIS — Z809 Family history of malignant neoplasm, unspecified: Secondary | ICD-10-CM

## 2018-04-09 DIAGNOSIS — Z7901 Long term (current) use of anticoagulants: Secondary | ICD-10-CM

## 2018-04-09 LAB — COMPREHENSIVE METABOLIC PANEL
ALBUMIN: 3.8 g/dL (ref 3.5–5.0)
ALT: 22 U/L (ref 0–44)
AST: 27 U/L (ref 15–41)
Alkaline Phosphatase: 54 U/L (ref 38–126)
Anion gap: 5 (ref 5–15)
BUN: 21 mg/dL — AB (ref 6–20)
CHLORIDE: 106 mmol/L (ref 98–111)
CO2: 27 mmol/L (ref 22–32)
CREATININE: 1.15 mg/dL (ref 0.61–1.24)
Calcium: 8.3 mg/dL — ABNORMAL LOW (ref 8.9–10.3)
GFR calc Af Amer: >60 mL/min (ref >60)
GLUCOSE: 114 mg/dL — AB (ref 70–99)
POTASSIUM: 3.8 mmol/L (ref 3.5–5.1)
SODIUM: 138 mmol/L (ref 135–145)
Total Bilirubin: 0.6 mg/dL (ref 0.3–1.2)
Total Protein: 6.6 g/dL (ref 6.5–8.1)

## 2018-04-09 LAB — PROTIME-INR
INR: 1.02
PROTHROMBIN TIME: 13.3 s (ref 11.4–15.2)

## 2018-04-09 LAB — APTT: APTT: 73 s — AB (ref 24–36)

## 2018-04-09 MED ORDER — IOHEXOL 300 MG/ML  SOLN
75.0000 mL | Freq: Once | INTRAMUSCULAR | Status: AC | PRN
  Administered 2018-04-09: 75 mL via INTRAVENOUS

## 2018-04-09 NOTE — Consult Note (Signed)
[facility] CONSULT NOTE  Patient Care Team: Marisue Ivan, MD as PCP - General (Family Medicine)  CHIEF COMPLAINTS/PURPOSE OF CONSULTATION:  Adrenal hemorrhage on anticoagulation  HISTORY OF PRESENTING ILLNESS:  Alexander [name]. 52 y.o.  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   Patient noted to have worsening abdominal pain/flank pain for the last few days.  He was previously seen in the emergency room and a CT scan showed ileus.  However further work-up including repeat CT scan at this admission showed right adrenal hemorrhage.  Patient is currently off Eliquis.  Patient's hemoglobin is stable around 15-16.  Otherwise denies any recent DVT PE.  Denies any headaches.  Denies any nausea vomiting.  ROS: A complete 10 point review of system is done which is negative except mentioned above in history of present illness  MEDICAL HISTORY:  Past Medical History:  Diagnosis Date  . DVT (deep venous thrombosis) (HCC)   . History of pulmonary embolus (PE)     SURGICAL HISTORY:  Past Surgical History:  Procedure Laterality Date  . NASAL FRACTURE SURGERY    . VASECTOMY      SOCIAL HISTORY: No smoking no alcohol.  Is fairly active. Social History   Socioeconomic History  . Marital status: Married    Spouse name: Not on file  . Number of children: Not on file  . Years of education: Not on file  . Highest education level: Not on file  Occupational History  . Not on file  Social Needs  . Financial resource strain: Not on file  . Food insecurity:    Worry: Not on file    Inability: Not on file  . Transportation needs:    Medical: Not on file    Non-medical: Not on file  Tobacco Use  . Smoking status: Never Smoker  . Smokeless tobacco: Never Used  Substance and Sexual Activity  . 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 Drug use: No  . Sexual activity: Not on file  Lifestyle  . Physical activity:    Days per week: Not on file    Minutes per session: Not on file  . Stress: Not on file  Relationships  . Social connections:    Talks on phone: Not on file    Gets together: Not on file    Attends religious service: Not on file    Active member of club or organization: Not on file    Attends meetings of clubs or organizations: Not on file    Relationship status: Not on file  . Intimate partner violence:    Fear of current or ex partner: Not on file    Emotionally abused: Not on file    Physically abused: Not on file    Forced sexual activity: Not on file  Other Topics Concern  . Not on file  Social History Narrative  . Not on file    FAMILY HISTORY: Family History  Problem Relation Age of Onset  . Heart attack Father   . Stroke Father   . Cancer Father 32       Prostate &/or bladder    ALLERGIES:  is allergic to oxycodone-acetaminophen and tramadol.  MEDICATIONS:  Current Facility-Administered Medications  Medication Dose Route Frequency Provider Last Rate Last Dose  . acetaminophen (TYLENOL) tablet 650 mg  650 mg Oral Q6H PRN Salary, Montell D, MD       Or  . acetaminophen (TYLENOL) suppository 650 mg  650 mg Rectal Q6H PRN Salary, Montell D, MD      . dextrose 5 %-0.45 % sodium chloride infusion   Intravenous Continuous Salary, Montell D, MD 100 mL/hr at 04/09/18 1557    . docusate sodium (COLACE) capsule 100 mg  100 mg Oral Q12H Salary, Montell D, MD   100 mg at 04/09/18 2005  . HYDROcodone-acetaminophen (NORCO/VICODIN) 5-325 MG per tablet 1-2 tablet  1-2 tablet Oral Q4H PRN Salary, Montell D, MD      . morphine 4 MG/ML injection 3 mg  3 mg Intravenous Q2H PRN Salary, Montell D, MD   3 mg at 04/09/18 2005  . ondansetron (ZOFRAN) tablet 4 mg  4 mg Oral Q6H PRN Salary, Montell D, MD       Or  . ondansetron (ZOFRAN) injection 4 mg  4 mg Intravenous Q6H PRN Salary,  Montell D, MD          .  PHYSICAL EXAMINATION:  Vitals:   04/09/18 1941 04/09/18 1942  BP: (!) 139/109 (!) 158/105  Pulse: 82 86  Resp: (!) 22   Temp: 99.4 F (37.4 C)   SpO2: 100% 100%   Filed Weights   04/08/18 1330  Weight: 213 lb (96.6 kg)    GENERAL: Well-nourished well-developed; Alert, no distress and comfortable.   Accompanied by a friend. EYES: no pallor or icterus OROPHARYNX: no thrush or ulceration. NECK: supple, no masses felt LYMPH:  no palpable lymphadenopathy in the cervical, axillary or inguinal regions LUNGS: decreased breath sounds to auscultation at bases and  No wheeze or crackles HEART/CVS: regular rate & rhythm and no murmurs; No lower extremity edema ABDOMEN: abdomen soft, non-tender and normal bowel sounds Musculoskeletal:no cyanosis of digits and no clubbing  PSYCH: alert & oriented x 3 with fluent speech NEURO: no focal motor/sensory deficits SKIN:  no rashes or significant lesions  LABORATORY DATA:  I have reviewed the data as listed Lab Results  Component Value Date   WBC 12.3 (H) 04/08/2018   HGB 16.1 04/08/2018   HCT 46.2 04/08/2018   MCV 94.6 04/08/2018   PLT 165 04/08/2018   Recent Labs    04/05/18 1726 04/06/18 1756 04/08/18 1332 04/09/18 0302  NA 142 139 137 138  K 3.8 3.6 4.5 3.8  CL 107 105 101 106  CO2 28 24 29 27   GLUCOSE 122* 148* 107* 114*  BUN 20 11 20  21*  CREATININE 1.40* 1.15 1.16 1.15  CALCIUM 9.3 9.5 9.5 8.3*  GFRNONAA 57* >60 >60 >60  GFRAA >60 >60 >60 >60  PROT 6.9 7.6 8.5* 6.6  ALBUMIN 4.3 4.5 5.0 3.8  AST 28 22 34 27  ALT 26 24 22 22   ALKPHOS 48 61 66 54  BILITOT 0.7 0.9 1.3* 0.6  BILIDIR <0.1  --   --   --   IBILI NOT CALCULATED  --   --   --     RADIOGRAPHIC STUDIES: I have personally reviewed the radiological images as listed and agreed with the findings in the report. Dg Chest 2 View  Result Date: 04/05/2018 CLINICAL DATA:  Lambert Mody right-sided rib pain. History of pulmonary embolus. EXAM:  CHEST - 2 VIEW COMPARISON:  July 06, 2016 FINDINGS: The heart size and mediastinal contours are within normal limits. Both lungs are clear. The visualized skeletal structures are unremarkable. IMPRESSION: No active cardiopulmonary  disease. Electronically Signed   By: Gerome Samavid  Williams III M.D   On: 04/05/2018 17:50   Ct Chest W Contrast  Result Date: 04/09/2018 CLINICAL DATA:  Adrenal hemorrhage EXAM: CT CHEST WITH CONTRAST TECHNIQUE: Multidetector CT imaging of the chest was performed during intravenous contrast administration. CONTRAST:  75mL OMNIPAQUE IOHEXOL 300 MG/ML  SOLN COMPARISON:  CT chest 04/15/2017 FINDINGS: Cardiovascular: The heart size is normal. No substantial pericardial effusion. Ascending thoracic aorta measures 4.1 cm diameter. Mediastinum/Nodes: No mediastinal lymphadenopathy. There is no hilar lymphadenopathy. The esophagus has normal imaging features. There is no axillary lymphadenopathy. Lungs/Pleura: Right middle and lower lobe subsegmental no focal airspace consolidation. No pulmonary edema or pleural effusion. Atelectasis noted in the right base. Upper Abdomen: Right adrenal mass with stranding in the right retroperitoneal space better characterized on abdominal CT yesterday. Musculoskeletal: No worrisome lytic or sclerotic osseous abnormality. IMPRESSION: 1. Asymmetric elevation right hemidiaphragm with subsegmental atelectasis at the right base. 2. Ascending thoracic aorta measures 4.1 cm diameter. Recommend annual imaging followup by CTA or MRA. This recommendation follows 2010 ACCF/AHA/AATS/ACR/ASA/SCA/SCAI/SIR/STS/SVM Guidelines for the Diagnosis and Management of Patients with Thoracic Aortic Disease. Circulation. 2010; 121: Z610-R604: [id number] Electronically Signed   By: Kennith CenterEric  Mansell M.D.   On: 04/09/2018 17:30   Ct Abdomen Pelvis W Contrast  Result Date: 04/08/2018 CLINICAL DATA:  Generalized abdominal pain with possible ileus or obstruction. Last bowel movement 3 days ago. Patient  not passing gas. EXAM: CT ABDOMEN AND PELVIS WITH CONTRAST TECHNIQUE: Multidetector CT imaging of the abdomen and pelvis was performed using the standard protocol following bolus administration of intravenous contrast. CONTRAST:  100mL ISOVUE-370 IOPAMIDOL (ISOVUE-370) INJECTION 76% COMPARISON:  KUB April 08, 2018.  CT scan April 05, 2018. FINDINGS: Lower chest: Mild atelectasis in the right lung. No other abnormalities. Hepatobiliary: Hepatic steatosis. No focal liver lesions or masses. The gallbladder is normal. Portal vein is patent. Pancreas: Unremarkable. No pancreatic ductal dilatation or surrounding inflammatory changes. Spleen: Normal in size without focal abnormality. Adrenals/Urinary Tract: The left adrenal gland is normal. The right adrenal gland has enlarged in the interval with marked adjacent fat stranding. The attenuation of the right adrenal gland is 61 Hounsfield units. It is now rounded measuring 4.0 by 3.0 by 4.8 cm. Kidneys are normal in appearance with no hydronephrosis. The ureters and bladder are normal. Stomach/Bowel: The stomach is normal without distention. There are several clustered loops of mildly dilated jejunum in the left upper quadrant measuring up to 3.3 cm. The remainder of the small bowel is normal in caliber. Scattered colonic diverticuli are noted. No diverticulitis. There is fluid throughout most of the colon with air-fluid levels. No wall thickening. The appendix is normal. Vascular/Lymphatic: The abdominal aorta is normal in caliber without aneurysm or dissection. No atherosclerotic change. No adenopathy. Reproductive: Prostate calcifications. Other: No free air. There is a fat containing periumbilical hernia. There is a small amount of free fluid in the right upper quadrant with no fluid collection. Musculoskeletal: No acute or significant osseous findings. IMPRESSION: 1. The right adrenal gland is now rounded and masslike with an attenuation of 60 Hounsfield units, new in the  interval. There is significant adjacent fat stranding. The findings are most consistent with an adrenal hemorrhage of uncertain etiology. This is likely the cause of the patient's right-sided pain. 2. There are a few adjacent mildly dilated loops of jejunum in the left upper quadrant. The remainder of the small bowel loops are normal. The colon is fluid-filled along much of its length.  This constellation of findings most likely represents ileus or sequela of enteritis with diarrhea. Recommend clinical correlation and close follow-up. 3. No other abnormalities. Electronically Signed   By: Gerome Sam III M.D   On: 04/08/2018 17:50   Ct Abdomen Pelvis W Contrast  Result Date: 04/05/2018 CLINICAL DATA:  Right-sided abdominal pain EXAM: CT ABDOMEN AND PELVIS WITH CONTRAST TECHNIQUE: Multidetector CT imaging of the abdomen and pelvis was performed using the standard protocol following bolus administration of intravenous contrast. CONTRAST:  ISOVUE-300 IOPAMIDOL (ISOVUE-300) INJECTION 61% COMPARISON:  CT 11/05/2010, ultrasound 04/05/2018 FINDINGS: Lower chest: Lung bases demonstrate no acute consolidation or effusion. The heart size is within normal limits Hepatobiliary: No focal liver abnormality is seen. No gallstones, gallbladder wall thickening, or biliary dilatation. Pancreas: Unremarkable. No pancreatic ductal dilatation or surrounding inflammatory changes. Spleen: Normal in size without focal abnormality. Adrenals/Urinary Tract: Adrenal glands are unremarkable. Kidneys are normal, without renal calculi, focal lesion, or hydronephrosis. Bladder is unremarkable. Stomach/Bowel: Stomach is within normal limits. Appendix appears normal. No evidence of bowel wall thickening, distention, or inflammatory changes. Large stool in the cecum. Sigmoid colon diverticular disease without acute inflammation Vascular/Lymphatic: No significant vascular findings are present. No enlarged abdominal or pelvic lymph nodes.  Reproductive: Enlarged prostate with calcification Other: Negative for free air or free fluid. Musculoskeletal: No acute or significant osseous findings. IMPRESSION: 1. No CT evidence for acute intra-abdominal or pelvic abnormality. 2. Sigmoid colon diverticular disease without acute inflammation 3. Prostatomegaly Electronically Signed   By: Jasmine Pang M.D.   On: 04/05/2018 22:15   US Renal  Result Date: 04/06/2018 CLINICAL DATA:  Right flank pain EXAM: RENAL / URINARY TRACT ULTRASOUND COMPLETE COMPARISON:  None. FINDINGS: Right Kidney: Length: 9.8 cm. Echogenicity within normal limits. No mass or hydronephrosis visualized. Left Kidney: Length: 11.3 cm. Echogenicity within normal limits. No mass or hydronephrosis visualized. Bladder: Appears normal for degree of bladder distention. IMPRESSION: No acute findings.  No hydronephrosis. Electronically Signed   By: Charlett Nose M.D.   On: 04/06/2018 20:06   Dg Abd 2 Views  Result Date: 04/08/2018 CLINICAL DATA:  Right side abdominal pain since 04/05/2018. EXAM: ABDOMEN - 2 VIEW COMPARISON:  CT abdomen and pelvis scratch the CT chest, CT abdomen and pelvis 04/05/2018. FINDINGS: No free intraperitoneal air is identified. Air-fluid levels are seen in both small and large bowel. The bowel gas pattern is not typical of obstruction. No abnormal abdominal calcification is identified. No acute or focal bony abnormality. IMPRESSION: Nonobstructive bowel gas pattern with air-fluid levels in both small and large bowel suggestive of enteritis and diarrhea. Electronically Signed   By: Drusilla Kanner M.D.   On: 04/08/2018 12:03   US Abdomen Limited Ruq  Result Date: 04/05/2018 CLINICAL DATA:  Abdominal pain EXAM: ULTRASOUND ABDOMEN LIMITED RIGHT UPPER QUADRANT COMPARISON:  04/14/2013 FINDINGS: Gallbladder: No gallstones or wall thickening visualized. No sonographic Murphy sign noted by sonographer. Common bile duct: Diameter: 5 mm in diameter Liver: No focal lesion  identified. Within normal limits in parenchymal echogenicity. Portal vein is patent on color Doppler imaging with normal direction of blood flow towards the liver. IMPRESSION: Normal right upper quadrant ultrasound. Electronically Signed   By: Charlett Nose M.D.   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  Pain is significant improved.  No drop in hemoglobin noted.  The etiology of adrenal hemorrhage is unclear-question Eliquis versus metastatic disease versus antiphospholipid antibody syndrome. For now agree with holding of anticoagulation.  However patient anticoagulants need to be started on outpatient basis once acute issues resolve [see discussion below].  Recommend a CT of the chest to rule out any  # Recurrent DVT/PE-suspected antiphospholipid antibody syndrome most recent on Eliquis.  Eliquis on hold because of about adrenal hemorrhage.  Will order antiphospholipid antibody syndrome work-up especially as patient is currently not on Eliquis.  However given patient's prior history of blood clots/possible antiphospholipid antibody syndrome patient will likely need to be on long-term indefinite anticoagulation.  However, anticoagulation could be started when patient adrenal hemorrhage is stable.  #The above plan of care was discussed with the patient in detail.  Also discussed with Dr. Cherlynn Kaiser.  Thank you Dr.Sainani for allowing me to participate in the care of your pleasant patient. Please do not hesitate to contact me with questions or concerns in the interim.  All questions were answered. The patient knows to call the clinic with any problems, questions or concerns.    Earna Coder,  MD 04/09/2018 10:19 PM

## 2018-04-09 NOTE — Progress Notes (Signed)
[facility] [place] [facility]   PATIENT NAME: Alexander [name]    MR#:  [id number]  DATE OF BIRTH:  Jan 07, 1966  SUBJECTIVE:   Patient presents to the hospital with abdominal pain and noted to have CT of the abdomen pelvis which is suggestive of a right-sided adrenal hemorrhage with a possible ileus.  This morning patient's abdominal pain had resolved.  He denies any nausea vomiting or any fever.  Hemodynamically stable.  REVIEW OF SYSTEMS:    Review of Systems  Constitutional: Negative for chills and fever.  HENT: Negative for congestion and tinnitus.   Eyes: Negative for blurred vision and double vision.  Respiratory: Negative for cough, shortness of breath and wheezing.   Cardiovascular: Negative for chest pain, orthopnea and PND.  Gastrointestinal: Negative for abdominal pain, diarrhea, nausea and vomiting.  Genitourinary: Negative for dysuria and hematuria.  Neurological: Negative for dizziness, sensory change and focal weakness.  All other systems reviewed and are negative.   Nutrition: Clear liquid and will advance to Regular Tolerating Diet: Yes Tolerating PT: Ambulatory  DRUG ALLERGIES:   Allergies  Allergen Reactions  . Oxycodone-Acetaminophen Nausea Only  . Tramadol Other (See Comments)    Causes depression    VITALS:  Blood pressure 105/62, pulse 77, temperature 98.5 F (36.9 C), temperature source Oral, resp. rate 20, height 5\' 10"  (1.778 m), weight 96.6 kg (213 lb), SpO2 96 %.  PHYSICAL EXAMINATION:   Physical Exam  GENERAL:  52 y.o.-year-old patient lying in bed in no acute distress.  EYES: Pupils equal, round, reactive to light and accommodation. No scleral icterus. Extraocular muscles intact.  HEENT: Head atraumatic, normocephalic. Oropharynx and nasopharynx clear.  NECK:  Supple, no jugular venous distention. No thyroid enlargement, no tenderness.  LUNGS: Normal breath sounds bilaterally, no wheezing, rales, rhonchi. No use of  accessory muscles of respiration.  CARDIOVASCULAR: S1, S2 normal. No murmurs, rubs, or gallops.  ABDOMEN: Soft, mild right flank pain, no rebound, rigidity, nondistended. Bowel sounds present. No organomegaly or mass.  EXTREMITIES: No cyanosis, clubbing or edema b/l.    NEUROLOGIC: Cranial nerves II through XII are intact. No focal Motor or sensory deficits b/l.   PSYCHIATRIC: The patient is alert and oriented x 3.  SKIN: No obvious rash, lesion, or ulcer.    LABORATORY PANEL:   CBC Recent Labs  Lab 04/08/18 1332  WBC 12.3*  HGB 16.1  HCT 46.2  PLT 165   ------------------------------------------------------------------------------------------------------------------  Chemistries  Recent Labs  Lab 04/09/18 0302  NA 138  K 3.8  CL 106  CO2 27  GLUCOSE 114*  BUN 21*  CREATININE 1.15  CALCIUM 8.3*  AST 27  ALT 22  ALKPHOS 54  BILITOT 0.6   ------------------------------------------------------------------------------------------------------------------  Cardiac Enzymes Recent Labs  Lab 04/05/18 1726  TROPONINI 0.03*   ------------------------------------------------------------------------------------------------------------------  RADIOLOGY:  Ct Abdomen Pelvis W Contrast  Result Date: 04/08/2018 CLINICAL DATA:  Generalized abdominal pain with possible ileus or obstruction. Last bowel movement 3 days ago. Patient not passing gas. EXAM: CT ABDOMEN AND PELVIS WITH CONTRAST TECHNIQUE: Multidetector CT imaging of the abdomen and pelvis was performed using the standard protocol following bolus administration of intravenous contrast. CONTRAST:  ISOVUE-370 IOPAMIDOL (ISOVUE-370) INJECTION 76% COMPARISON:  KUB April 08, 2018.  CT scan April 05, 2018. FINDINGS: Lower chest: Mild atelectasis in the right lung. No other abnormalities. Hepatobiliary: Hepatic steatosis. No focal liver lesions or masses. The gallbladder is normal. Portal vein is patent. Pancreas: Unremarkable.  No pancreatic ductal  dilatation or surrounding inflammatory changes. Spleen: Normal in size without focal abnormality. Adrenals/Urinary Tract: The left adrenal gland is normal. The right adrenal gland has enlarged in the interval with marked adjacent fat stranding. The attenuation of the right adrenal gland is 61 Hounsfield units. It is now rounded measuring 4.0 by 3.0 by 4.8 cm. Kidneys are normal in appearance with no hydronephrosis. The ureters and bladder are normal. Stomach/Bowel: The stomach is normal without distention. There are several clustered loops of mildly dilated jejunum in the left upper quadrant measuring up to 3.3 cm. The remainder of the small bowel is normal in caliber. Scattered colonic diverticuli are noted. No diverticulitis. There is fluid throughout most of the colon with air-fluid levels. No wall thickening. The appendix is normal. Vascular/Lymphatic: The abdominal aorta is normal in caliber without aneurysm or dissection. No atherosclerotic change. No adenopathy. Reproductive: Prostate calcifications. Other: No free air. There is a fat containing periumbilical hernia. There is a small amount of free fluid in the right upper quadrant with no fluid collection. Musculoskeletal: No acute or significant osseous findings. IMPRESSION: 1. The right adrenal gland is now rounded and masslike with an attenuation of 60 Hounsfield units, new in the interval. There is significant adjacent fat stranding. The findings are most consistent with an adrenal hemorrhage of uncertain etiology. This is likely the cause of the patient's right-sided pain. 2. There are a few adjacent mildly dilated loops of jejunum in the left upper quadrant. The remainder of the small bowel loops are normal. The colon is fluid-filled along much of its length. This constellation of findings most likely represents ileus or sequela of enteritis with diarrhea. Recommend clinical correlation and close follow-up. 3. No other  abnormalities. Electronically Signed   By: Gerome Sam III M.D   On: 04/08/2018 17:50   Dg Abd 2 Views  Result Date: 04/08/2018 CLINICAL DATA:  Right side abdominal pain since 04/05/2018. EXAM: ABDOMEN - 2 VIEW COMPARISON:  CT abdomen and pelvis scratch the CT chest, CT abdomen and pelvis 04/05/2018. FINDINGS: No free intraperitoneal air is identified. Air-fluid levels are seen in both small and large bowel. The bowel gas pattern is not typical of obstruction. No abnormal abdominal calcification is identified. No acute or focal bony abnormality. IMPRESSION: Nonobstructive bowel gas pattern with air-fluid levels in both small and large bowel suggestive of enteritis and diarrhea. Electronically Signed   By: Drusilla Kanner M.D.   On: 04/08/2018 12:03     ASSESSMENT AND PLAN:   52 yo w/ PMH of recurrent DVT/PE on chronic anticoagulation who presented to the hospital due to worsening abdominal pain and had CT abdomen pelvis findings suggestive of a right adrenal hemorrhage along with a ileus.  1.  Abdominal pain-suspected to be secondary to the adrenal hemorrhage and also with underlying ileus.  The ileus was a result of the patient having the adrenal hemorrhage.   - patient's pain is much improved now he denies any nausea or vomiting. -Tolerating a clear liquid diet and will advance to regular diet today.  2.  Ileus-resolving, feels much better, no nausea vomiting worsening pain. - We will advance diet  3.  Adrenal hemorrhage-this was noted on the CT scan of the abdomen pelvis on admission.  The source of this is unclear. - Seen by oncology, she is followed by Dr. Donneta Romberg and will get CT chest to rule out underlying malignancy.  Hold off on Eliquis for now.  4.  History of recurrent DVT/PE- Eliquis is on  hold due to the adrenal hemorrhage as mentioned above. - Likely he will need to be discharged off anticoagulation with outpatient follow-up with hematology oncology    All the records  are reviewed and case discussed with Care Management/Social Worker. Management plans discussed with the patient, family and they are in agreement.  CODE STATUS: Full code  DVT Prophylaxis: Teds and SCDs  TOTAL TIME TAKING CARE OF THIS PATIENT: 30 minutes.   POSSIBLE D/C IN 1-2 DAYS, DEPENDING ON CLINICAL CONDITION.   Houston SirenSAINANI,VIVEK J M.D on 04/09/2018 at 1:57 PM  Between 7am to 6pm - Pager - [phone]  After 6pm go to [email or web address] - Social research officer, governmentpassword [email or web address]  [facility]  Office  [phone]  CC: Primary care physician; Marisue IvanLinthavong, [place]hka, MD

## 2018-04-10 DIAGNOSIS — E2749 Other adrenocortical insufficiency: Secondary | ICD-10-CM

## 2018-04-10 LAB — CBC
HEMATOCRIT: 39.6 % — AB (ref 40.0–52.0)
HEMOGLOBIN: 13.8 g/dL (ref 13.0–18.0)
MCH: 33.1 pg (ref 26.0–34.0)
MCHC: 34.8 g/dL (ref 32.0–36.0)
MCV: 95.1 fL (ref 80.0–100.0)
Platelets: 146 10*3/uL — ABNORMAL LOW (ref 150–440)
RBC: 4.17 MIL/uL — AB (ref 4.40–5.90)
RDW: 13 % (ref 11.5–14.5)
WBC: 6.6 10*3/uL (ref 3.8–10.6)

## 2018-04-10 LAB — BASIC METABOLIC PANEL
ANION GAP: 6 (ref 5–15)
BUN: 19 mg/dL (ref 6–20)
CHLORIDE: 103 mmol/L (ref 98–111)
CO2: 29 mmol/L (ref 22–32)
CREATININE: 1.25 mg/dL — AB (ref 0.61–1.24)
Calcium: 8.6 mg/dL — ABNORMAL LOW (ref 8.9–10.3)
GFR calc non Af Amer: >60 mL/min (ref >60)
Glucose, Bld: 120 mg/dL — ABNORMAL HIGH (ref 70–99)
Potassium: 4 mmol/L (ref 3.5–5.1)
SODIUM: 138 mmol/L (ref 135–145)

## 2018-04-10 LAB — HIV ANTIBODY (ROUTINE TESTING W REFLEX): HIV SCREEN 4TH GENERATION: NONREACTIVE

## 2018-04-10 MED ORDER — SIMETHICONE 80 MG PO CHEW
80.0000 mg | CHEWABLE_TABLET | Freq: Four times a day (QID) | ORAL | Status: DC | PRN
  Administered 2018-04-10: 80 mg via ORAL
  Filled 2018-04-10 (×2): qty 1

## 2018-04-10 MED ORDER — KETOROLAC TROMETHAMINE 30 MG/ML IJ SOLN
30.0000 mg | Freq: Four times a day (QID) | INTRAMUSCULAR | Status: DC | PRN
  Administered 2018-04-10 – 2018-04-11 (×3): 30 mg via INTRAVENOUS
  Filled 2018-04-10 (×3): qty 1

## 2018-04-10 MED ORDER — TRAMADOL HCL 50 MG PO TABS: 50.0000 mg | ORAL_TABLET | Freq: Four times a day (QID) | ORAL | Status: DC | PRN

## 2018-04-10 MED ORDER — ENOXAPARIN SODIUM 100 MG/ML ~~LOC~~ SOLN
1.0000 mg/kg | Freq: Two times a day (BID) | SUBCUTANEOUS | Status: DC
  Administered 2018-04-10 – 2018-04-11 (×3): 95 mg via SUBCUTANEOUS
  Filled 2018-04-10 (×4): qty 1

## 2018-04-10 NOTE — Progress Notes (Signed)
Alexander [name].   DOB:05/23/1966   EA#:[id number]    Subjective: Right flank pain significantly improved.  Passing gas.  No nausea no vomiting.  No blood in stools.  No chest pain or shortness of breath cough.  Review of system appetite is good.  No constipation or diarrhea.  Objective:  Vitals:   04/10/18 1518 04/10/18 2019  BP: (!) 136/98 (!) 143/99  Pulse: 79 80  Resp: 14 20  Temp: 98.5 F (36.9 C) 98.5 F (36.9 C)  SpO2: 96% 95%     Intake/Output Summary (Last 24 hours) at 04/10/2018 2115 Last data filed at 04/10/2018 1906 Gross per 24 hour  Intake 1340 ml  Output 1350 ml  Net -10 ml    GENERAL Alert, no distress and comfortable.  Accompanied by his wife. EYES: no pallor or icterus OROPHARYNX: no thrush or ulceration. NECK: supple, no masses felt LYMPH:  no palpable lymphadenopathy in the cervical, axillary or inguinal regions LUNGS: decreased breath sounds to auscultation at bases and  No wheeze or crackles HEART/CVS: regular rate & rhythm and no murmurs; No lower extremity edema ABDOMEN: abdomen soft, tender  on deep palpation. and normal bowel sounds Musculoskeletal:no cyanosis of digits and no clubbing  PSYCH: alert & oriented x 3 with fluent speech NEURO: no focal motor/sensory deficits SKIN:  no rashes or significant lesions   Labs:  Lab Results  Component Value Date   WBC 6.6 04/10/2018   HGB 13.8 04/10/2018   HCT 39.6 (L) 04/10/2018   MCV 95.1 04/10/2018   PLT 146 (L) 04/10/2018   NEUTROABS 3.6 05/21/2017    Lab Results  Component Value Date   NA 138 04/10/2018   K 4.0 04/10/2018   CL 103 04/10/2018   CO2 29 04/10/2018    Studies:  Ct Chest W Contrast  Result Date: 04/09/2018 CLINICAL DATA:  Adrenal hemorrhage EXAM: CT CHEST WITH CONTRAST TECHNIQUE: Multidetector CT imaging of the chest was performed during intravenous contrast administration. CONTRAST:  75mL OMNIPAQUE IOHEXOL 300 MG/ML  SOLN COMPARISON:  CT chest 04/15/2017 FINDINGS:  Cardiovascular: The heart size is normal. No substantial pericardial effusion. Ascending thoracic aorta measures 4.1 cm diameter. Mediastinum/Nodes: No mediastinal lymphadenopathy. There is no hilar lymphadenopathy. The esophagus has normal imaging features. There is no axillary lymphadenopathy. Lungs/Pleura: Right middle and lower lobe subsegmental no focal airspace consolidation. No pulmonary edema or pleural effusion. Atelectasis noted in the right base. Upper Abdomen: Right adrenal mass with stranding in the right retroperitoneal space better characterized on abdominal CT yesterday. Musculoskeletal: No worrisome lytic or sclerotic osseous abnormality. IMPRESSION: 1. Asymmetric elevation right hemidiaphragm with subsegmental atelectasis at the right base. 2. Ascending thoracic aorta measures 4.1 cm diameter. Recommend annual imaging followup by CTA or MRA. This recommendation follows 2010 ACCF/AHA/AATS/ACR/ASA/SCA/SCAI/SIR/STS/SVM Guidelines for the Diagnosis and Management of Patients with Thoracic Aortic Disease. Circulation. 2010; 121: Y782-N562 Electronically Signed   By: Kennith Center M.D.   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  # Right adrenal hemorrhage/abdominal pain. The cause of hemorrhage is likely secondary to antiphospholipid syndrome/Eliquis.   Patient's imaging reviewed personally; and also reviewed at the tumor conference-right adrenal lesion most consistent with hemorrhage; no concerns for any metastatic disease.  CT chest negative for any metastatic disease.   # Currently pain is significant improved.  Hemoglobin is  very stable.  Recommend restarting Lovenox cautiously; monitor overnight.  Patient will likely need to be transitioned to Coumadin on outpatient  basis.  # Recurrent DVT/PE-suspected antiphospholipid antibody syndrome most recent on Eliquis.  Repeat antiphospholipid antibody syndrome work-up is pending.  Patient will need lifelong anticoagulation.   #The above plan of care was discussed with the patient/wife in detail.  Also discussed with Dr. Cherlynn KaiserSainani.  Earna CoderGovinda R Brahmanday, MD 04/10/2018  9:15 PM

## 2018-04-10 NOTE — Progress Notes (Signed)
[facility] [place] [facility]   PATIENT NAME: Alexander BlancoRobert [name]    MR#:  [id number]  DATE OF BIRTH:  [birth year]  SUBJECTIVE:   Has some right-sided flank pain but no hemodynamic instability, hemoglobin remained stable.  Seen by hematology oncology and they suspect antiphospholipid syndrome and therefore patient has been started on Lovenox.  Ileus has pretty much resolved.  REVIEW OF SYSTEMS:    Review of Systems  Constitutional: Negative for chills and fever.  HENT: Negative for congestion and tinnitus.   Eyes: Negative for blurred vision and double vision.  Respiratory: Negative for cough, shortness of breath and wheezing.   Cardiovascular: Negative for chest pain, orthopnea and PND.  Gastrointestinal: Positive for abdominal pain (right Flank). Negative for diarrhea, nausea and vomiting.  Genitourinary: Negative for dysuria and hematuria.  Neurological: Negative for dizziness, sensory change and focal weakness.  All other systems reviewed and are negative.   Nutrition: Regular Tolerating Diet: Yes Tolerating PT: Ambulatory  DRUG ALLERGIES:   Allergies  Allergen Reactions  . Oxycodone-Acetaminophen Nausea Only  . Tramadol Other (See Comments)    Causes depression    VITALS:  Blood pressure 121/83, pulse 75, temperature 98.7 F (37.1 C), temperature source Oral, resp. rate 20, height 5\' 10"  (1.778 m), weight 96.6 kg (213 lb), SpO2 97 %.  PHYSICAL EXAMINATION:   Physical Exam  GENERAL:  52 y.o.-year-old patient lying in bed in no acute distress.  EYES: Pupils equal, round, reactive to light and accommodation. No scleral icterus. Extraocular muscles intact.  HEENT: Head atraumatic, normocephalic. Oropharynx and nasopharynx clear.  NECK:  Supple, no jugular venous distention. No thyroid enlargement, no tenderness.  LUNGS: Normal breath sounds bilaterally, no wheezing, rales, rhonchi. No use of accessory muscles of respiration.  CARDIOVASCULAR: S1, S2  normal. No murmurs, rubs, or gallops.  ABDOMEN: Soft, mild right flank pain, no rebound, rigidity, nondistended. Bowel sounds present. No organomegaly or mass.  EXTREMITIES: No cyanosis, clubbing or edema b/l.    NEUROLOGIC: Cranial nerves II through XII are intact. No focal Motor or sensory deficits b/l.   PSYCHIATRIC: The patient is alert and oriented x 3.  SKIN: No obvious rash, lesion, or ulcer.    LABORATORY PANEL:   CBC Recent Labs  Lab 04/10/18 0309  WBC 6.6  HGB 13.8  HCT 39.6*  PLT 146*   ------------------------------------------------------------------------------------------------------------------  Chemistries  Recent Labs  Lab 04/09/18 0302 04/10/18 0309  NA 138 138  K 3.8 4.0  CL 106 103  CO2 27 29  GLUCOSE 114* 120*  BUN 21* 19  CREATININE 1.15 1.25*  CALCIUM 8.3* 8.6*  AST 27  --   ALT 22  --   ALKPHOS 54  --   BILITOT 0.6  --    ------------------------------------------------------------------------------------------------------------------  Cardiac Enzymes Recent Labs  Lab 04/05/18 1726  TROPONINI 0.03*   ------------------------------------------------------------------------------------------------------------------  RADIOLOGY:  Ct Chest W Contrast  Result Date: 04/09/2018 CLINICAL DATA:  Adrenal hemorrhage EXAM: CT CHEST WITH CONTRAST TECHNIQUE: Multidetector CT imaging of the chest was performed during intravenous contrast administration. CONTRAST:  75mL OMNIPAQUE IOHEXOL 300 MG/ML  SOLN COMPARISON:  CT chest 04/15/2017 FINDINGS: Cardiovascular: The heart size is normal. No substantial pericardial effusion. Ascending thoracic aorta measures 4.1 cm diameter. Mediastinum/Nodes: No mediastinal lymphadenopathy. There is no hilar lymphadenopathy. The esophagus has normal imaging features. There is no axillary lymphadenopathy. Lungs/Pleura: Right middle and lower lobe subsegmental no focal airspace consolidation. No pulmonary edema or pleural  effusion. Atelectasis noted in the right  base. Upper Abdomen: Right adrenal mass with stranding in the right retroperitoneal space better characterized on abdominal CT yesterday. Musculoskeletal: No worrisome lytic or sclerotic osseous abnormality. IMPRESSION: 1. Asymmetric elevation right hemidiaphragm with subsegmental atelectasis at the right base. 2. Ascending thoracic aorta measures 4.1 cm diameter. Recommend annual imaging followup by CTA or MRA. This recommendation follows 2010 ACCF/AHA/AATS/ACR/ASA/SCA/SCAI/SIR/STS/SVM Guidelines for the Diagnosis and Management of Patients with Thoracic Aortic Disease. Circulation. 2010; 121: Z610-R604 Electronically Signed   By: Kennith Center M.D.   On: 04/09/2018 17:30   Ct Abdomen Pelvis W Contrast  Result Date: 04/08/2018 CLINICAL DATA:  Generalized abdominal pain with possible ileus or obstruction. Last bowel movement 3 days ago. Patient not passing gas. EXAM: CT ABDOMEN AND PELVIS WITH CONTRAST TECHNIQUE: Multidetector CT imaging of the abdomen and pelvis was performed using the standard protocol following bolus administration of intravenous contrast. CONTRAST:  ISOVUE-370 IOPAMIDOL (ISOVUE-370) INJECTION 76% COMPARISON:  KUB April 08, 2018.  CT scan April 05, 2018. FINDINGS: Lower chest: Mild atelectasis in the right lung. No other abnormalities. Hepatobiliary: Hepatic steatosis. No focal liver lesions or masses. The gallbladder is normal. Portal vein is patent. Pancreas: Unremarkable. No pancreatic ductal dilatation or surrounding inflammatory changes. Spleen: Normal in size without focal abnormality. Adrenals/Urinary Tract: The left adrenal gland is normal. The right adrenal gland has enlarged in the interval with marked adjacent fat stranding. The attenuation of the right adrenal gland is 61 Hounsfield units. It is now rounded measuring 4.0 by 3.0 by 4.8 cm. Kidneys are normal in appearance with no hydronephrosis. The ureters and bladder are normal.  Stomach/Bowel: The stomach is normal without distention. There are several clustered loops of mildly dilated jejunum in the left upper quadrant measuring up to 3.3 cm. The remainder of the small bowel is normal in caliber. Scattered colonic diverticuli are noted. No diverticulitis. There is fluid throughout most of the colon with air-fluid levels. No wall thickening. The appendix is normal. Vascular/Lymphatic: The abdominal aorta is normal in caliber without aneurysm or dissection. No atherosclerotic change. No adenopathy. Reproductive: Prostate calcifications. Other: No free air. There is a fat containing periumbilical hernia. There is a small amount of free fluid in the right upper quadrant with no fluid collection. Musculoskeletal: No acute or significant osseous findings. IMPRESSION: 1. The right adrenal gland is now rounded and masslike with an attenuation of 60 Hounsfield units, new in the interval. There is significant adjacent fat stranding. The findings are most consistent with an adrenal hemorrhage of uncertain etiology. This is likely the cause of the patient's right-sided pain. 2. There are a few adjacent mildly dilated loops of jejunum in the left upper quadrant. The remainder of the small bowel loops are normal. The colon is fluid-filled along much of its length. This constellation of findings most likely represents ileus or sequela of enteritis with diarrhea. Recommend clinical correlation and close follow-up. 3. No other abnormalities. Electronically Signed   By: Gerome Sam III M.D   On: 04/08/2018 17:50     ASSESSMENT AND PLAN:   52 yo w/ PMH of recurrent DVT/PE on chronic anticoagulation who presented to the hospital due to worsening abdominal pain and had CT abdomen pelvis findings suggestive of a right adrenal hemorrhage along with a ileus.  1.  Abdominal pain-suspected to be secondary to the adrenal hemorrhage and also with underlying ileus.  The ileus was a result of the patient  having the adrenal hemorrhage.   -Patient still has some right flank pain  but otherwise clinically asymptomatic.  Tolerating a regular diet with no nausea vomiting. -Continue supportive care with pain control for now.  2.  Ileus-resolving, feels much better, no nausea vomiting worsening pain. -Now resolved.  Patient passing flatus, no nausea or vomiting, tolerating regular diet.  Appreciate surgical input.  3.  Adrenal hemorrhage-this was noted on the CT scan of the abdomen pelvis on admission.  The source of this is unclear. - Seen by oncology, he is followed by Dr. Donneta Romberg. -They think this is secondary to antiphospholipid syndrome.  Continue Lovenox and patient has been started on it.  Will follow for any further bleeding.  CT chest was negative for any acute mass or malignancy.  4.  History of recurrent DVT/PE- suspected to be due to antiphospholipid syndrome as per oncology. -Patient was on Eliquis, now switched over to Lovenox.  Discharge home tomorrow if hemoglobin remains stable and no further worsening of his abdominal pain.  All the records are reviewed and case discussed with Care Management/Social Worker. Management plans discussed with the patient, family and they are in agreement.  CODE STATUS: Full code  DVT Prophylaxis: Teds and SCDs  TOTAL TIME TAKING CARE OF THIS PATIENT: 30 minutes.   POSSIBLE D/C IN 1-2 DAYS, DEPENDING ON CLINICAL CONDITION.   Houston Siren M.D on 04/10/2018 at 2:10 PM  Between 7am to 6pm - Pager - [phone]  After 6pm go to [email or web address] - Social research officer, government  [facility]  Office  [phone]  CC: Primary care physician; Marisue Ivan, MD

## 2018-04-10 NOTE — Consult Note (Addendum)
Patient ID: Alexander [name]., male   DOB: [birth year], 52 y.o.   MRN: [id number]  HPI Alexander [name]. is a 52 y.o. male seen at the request of Dr Alexander [name] ( case [name]/w him) .  Came into the emergency room complaining of right-sided abdominal pain and constipation.  Patient reports started about 3 to 4 days ago and is sharp and intermittent nature.  The pain was moderate to severe intensity.  He came to the emergency room 21 ecchymosis, and once here and was diagnosed with constipation.  He was sent home with a laxative without any major improvement.  Now came back to the emergency room on the 30th where a CT scan was performed.  I have personally reviewed the CT scan shows a right adrenal hemorrhage.  There is no evidence of a blockage or expansion component.  Patient is on Eliquis for a DVT, PE and centimeters antiphospholipid syndrome.  He is followed by Alexander [name]. He is able to perform more than 6 METS of activity without any shortness of breath or chest pain.  He denies any hematuria.  Denies any weight loss.  No B type symptoms. Arrival was around 15 and has been stable since, his hb dropped this am 13.8 but he is on MIVF and likely there is significanty dilutional component.  His abdominal pain has improved significantly.  He did have a component of ileus that has been resolving and currently is taking a regular diet which he tolerated well without any vomiting. CMP and coags are nml  HPI  Past Medical History:  Diagnosis Date  . DVT (deep venous thrombosis) (HCC)   . History of pulmonary embolus (PE)     Past Surgical History:  Procedure Laterality Date  . NASAL FRACTURE SURGERY    . VASECTOMY      Family History  Problem Relation Age of Onset  . Heart attack Father   . Stroke Father   . Cancer Father 24       Prostate &/or bladder    Social History Social History   Tobacco Use  . Smoking status: Never Smoker  . Smokeless tobacco: Never Used  Substance Use Topics  .  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 Drug use: No    Allergies  Allergen Reactions  . Oxycodone-Acetaminophen Nausea Only  . Tramadol Other (See Comments)    Causes depression    Current Facility-Administered Medications  Medication Dose Route Frequency Provider Last Rate Last Dose  . acetaminophen (TYLENOL) tablet 650 mg  650 mg Oral Q6H PRN Salary, Montell D, MD       Or  . acetaminophen (TYLENOL) suppository 650 mg  650 mg Rectal Q6H PRN Salary, Montell D, MD      . docusate sodium (COLACE) capsule 100 mg  100 mg Oral Q12H Salary, Montell D, MD   100 mg at 04/09/18 2005  . HYDROcodone-acetaminophen (NORCO/VICODIN) 5-325 MG per tablet 1-2 tablet  1-2 tablet Oral Q4H PRN Salary, Montell D, MD      . morphine 4 MG/ML injection 3 mg  3 mg Intravenous Q2H PRN Salary, Montell D, MD   3 mg at 04/10/18 0601  . ondansetron (ZOFRAN) tablet 4 mg  4 mg Oral Q6H PRN Salary, Montell D, MD       Or  . ondansetron (ZOFRAN) injection 4 mg  4 mg Intravenous Q6H PRN Salary, Alexander Asa, MD  Review of Systems Full ROS  was asked and was negative except for the information on the HPI  Physical Exam Blood pressure 121/83, pulse 75, temperature 98.7 F (37.1 C), temperature source Oral, resp. rate 20, height 5\' 10"  (1.778 m), weight 213 lb (96.6 kg), SpO2 97 %. CONSTITUTIONAL: NAd EYES: Pupils are equal, round, and reactive to light, Sclera are non-icteric. EARS, NOSE, MOUTH AND THROAT: The oropharynx is clear. The oral mucosa is pink and moist. Hearing is intact to voice. LYMPH NODES:  Lymph nodes in the neck are normal. RESPIRATORY:  Lungs are clear. There is normal respiratory effort, with equal breath sounds bilaterally, and without pathologic use of accessory muscles. CARDIOVASCULAR: Heart is regular without murmurs, gallops, or rubs. GI: The abdomen is  soft,  and nondistended. There are no palpable masses. There is no hepatosplenomegaly.  There is Right CVA  tenderness . No expanding or retroperitoneal hematomas. GU: Rectal deferred.   MUSCULOSKELETAL: Normal muscle strength and tone. No cyanosis or edema.   SKIN: Turgor is good and there are no pathologic skin lesions or ulcers. NEUROLOGIC: Motor and sensation is grossly normal. Cranial nerves are grossly intact. PSYCH:  Oriented to person, place and time. Affect is normal.  Data Reviewed  I have personally reviewed the patient's imaging, laboratory findings and medical records.    Assessment/ Plan 52 year old male with a history of DVT for antiphospholipid syndrome now came in with a right adrenal hemorrhage.  Cannot completely exclude cancer or metastatic disease.  There is no evidence of current PE.  For now we will recommend stopping anticoagulation and serial abdominal exams as well as hemoglobin.  There is no need for embolization or surgical intervention at this time.  I do recommend probably outpatient work-up with a repeat CT scan.  I will follow him in the office to perform this further work-up.  We may have to work him out for an adrenal functioning mass as well but I would like to wait at least 6 weeks for all the inflammatory response of the hematoma to be resolved to determine whether or not this is a potential neoplastic mass.  Sterling Bigiego Pabon, MD FACS General Surgeon 04/10/2018, 6:47 AM

## 2018-04-11 ENCOUNTER — Telehealth: Payer: Self-pay | Admitting: Internal Medicine

## 2018-04-11 DIAGNOSIS — I2782 Chronic pulmonary embolism: Secondary | ICD-10-CM

## 2018-04-11 DIAGNOSIS — D6859 Other primary thrombophilia: Secondary | ICD-10-CM

## 2018-04-11 DIAGNOSIS — K567 Ileus, unspecified: Secondary | ICD-10-CM

## 2018-04-11 LAB — CBC
HCT: 39.5 % — ABNORMAL LOW (ref 40.0–52.0)
Hemoglobin: 13.9 g/dL (ref 13.0–18.0)
MCH: 32.9 pg (ref 26.0–34.0)
MCHC: 35.2 g/dL (ref 32.0–36.0)
MCV: 93.6 fL (ref 80.0–100.0)
PLATELETS: 149 10*3/uL — AB (ref 150–440)
RBC: 4.22 MIL/uL — ABNORMAL LOW (ref 4.40–5.90)
RDW: 12.9 % (ref 11.5–14.5)
WBC: 5.5 10*3/uL (ref 3.8–10.6)

## 2018-04-11 LAB — ANTIPHOSPHOLIPID SYNDROME PROF
ANTICARDIOLIPIN IGM: 90 [MPL'U]/mL — AB (ref 0–12)
Anticardiolipin IgG: 11 GPL U/mL (ref 0–14)
DRVVT: 136.5 s — ABNORMAL HIGH (ref 0.0–47.0)
PTT LA: 90.8 s — AB (ref 0.0–51.9)

## 2018-04-11 LAB — DRVVT MIX: dRVVT Mix: 125.6 s — ABNORMAL HIGH (ref 0.0–47.0)

## 2018-04-11 LAB — PTT-LA MIX: PTT-LA Mix: 90.8 s — ABNORMAL HIGH (ref 0.0–48.9)

## 2018-04-11 LAB — DRVVT CONFIRM: dRVVT Confirm: 2.9 ratio — ABNORMAL HIGH (ref 0.8–1.2)

## 2018-04-11 LAB — HEXAGONAL PHASE PHOSPHOLIPID: Hexagonal Phase Phospholipid: 88 s — ABNORMAL HIGH (ref 0–11)

## 2018-04-11 MED ORDER — TRAMADOL HCL 50 MG PO TABS: 50.0000 mg | ORAL_TABLET | Freq: Four times a day (QID) | ORAL | 0 refills | Status: AC | PRN

## 2018-04-11 MED ORDER — ENOXAPARIN SODIUM 100 MG/ML ~~LOC~~ SOLN: 100.0000 mg | Freq: Two times a day (BID) | SUBCUTANEOUS | 1 refills | Status: AC

## 2018-04-11 NOTE — Addendum Note (Signed)
Addended by: Gala MurdochJONES, Odie Rauen R on: 04/11/2018 11:57 AM   Modules accepted: Orders

## 2018-04-11 NOTE — Discharge Summary (Signed)
[facility] [place] [facility]   PATIENT NAME: Alexander [name]    MR#:  [id number]  DATE OF BIRTH:  09-Apr-1966  DATE OF ADMISSION:  04/08/2018 ADMITTING PHYSICIAN: Bertrum Sol, MD  DATE OF DISCHARGE: 04/11/2018 10:48 AM  PRIMARY CARE PHYSICIAN: Marisue Ivan, MD    ADMISSION DIAGNOSIS:  Adrenal hemorrhage (HCC) [E27.49] Ileus (HCC) [K56.7]  DISCHARGE DIAGNOSIS:  Active Problems:   Ileus (HCC)   Adrenal hemorrhage (HCC)   SECONDARY DIAGNOSIS:   Past Medical History:  Diagnosis Date  . DVT (deep venous thrombosis) (HCC)   . 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  1.  Abdominal pain-suspected to be secondary to the adrenal hemorrhage and also with underlying ileus.   -Most this patient's abdominal pain was secondary to his right-sided adrenal hemorrhage.  He did have a mild ileus after the hemorrhage but which is resolved quickly with supportive care. -Patient presently is tolerating p.o. well without any further nausea vomiting or abdominal pain and is being discharged home.  2.  Ileus- as a result of the adrenal hemorrhage, patient was treated supportively with IV fluids, antiemetics and pain control.  Her surgical consult was obtained, patient did not require any surgical intervention.  His diet was slowly advanced from a clear to a regular diet which he is tolerating without any exacerbation of his symptoms.  He is being discharged home.   3.  Adrenal hemorrhage-this was noted on the CT scan of the abdomen pelvis on admission.   - Patient was seen by oncology and the source of this was likely antiphospholipid syndrome.  Patient has had a previous history of DVT and PE and lost follow-up to hematology oncology and  they suspected antiphospholipid syndrome then.  The suspicion is patient probably clotted off his adrenal vessels due to his antiphospholipid syndrome despite being on Eliquis. -Patient has been switched over to subcu Lovenox is currently being discharged on that and remained stable on it.  He will follow-up with hematology oncology as an outpatient.  Patient is to follow-up with Dr. Donneta Romberg.  4.  History of recurrent DVT/PE- suspected to be due to antiphospholipid syndrome as per oncology. -Patient will continue subcu Lovenox to be eventually switched over to oral Coumadin as an outpatient.    DISCHARGE CONDITIONS:   Stable  CONSULTS OBTAINED:  Treatment Team:  Rosey Bath, MD  DRUG ALLERGIES:   Allergies  Allergen Reactions  . Oxycodone-Acetaminophen Nausea Only  . Tramadol Other (See Comments)    Causes depression    DISCHARGE MEDICATIONS:   Allergies as of 04/11/2018      Reactions   Oxycodone-acetaminophen Nausea Only   Tramadol Other (See Comments)   Causes depression      Medication List    STOP taking these medications   apixaban 5 MG Tabs tablet Commonly known as:  ELIQUIS   HYDROcodone-acetaminophen 5-325 MG tablet Commonly known as:  NORCO     TAKE these medications   dicyclomine 20 MG tablet Commonly known as:  BENTYL Take 1 tablet (20 mg total) by mouth 2 (two) times daily as needed for spasms.   docusate sodium 100 MG capsule Commonly known as:  COLACE Take 1 capsule (100 mg total) by mouth every  12 (twelve) hours.   enoxaparin 100 MG/ML injection Commonly known as:  LOVENOX Inject 1 mL (100 mg total) into the skin every 12 (twelve) hours.   ondansetron 4 MG tablet Commonly known as:  ZOFRAN Take 1 tablet (4 mg total) by mouth every 6 (six) hours as needed for nausea or vomiting.   polyethylene glycol packet Commonly known as:  MIRALAX / GLYCOLAX Take 17 g by mouth daily. What changed:    when to take this  reasons to take  this   traMADol 50 MG tablet Commonly known as:  ULTRAM Take 1 tablet (50 mg total) by mouth every 6 (six) hours as needed.   valACYclovir 1000 MG tablet Commonly known as:  VALTREX Take 1 tablet (1,000 mg total) by mouth 3 (three) times daily for 7 days.         DISCHARGE INSTRUCTIONS:   DIET:  Regular diet  DISCHARGE CONDITION:  Stable  ACTIVITY:  Activity as tolerated  OXYGEN:  Home Oxygen: No.   Oxygen Delivery: room air  DISCHARGE LOCATION:  home   If you experience worsening of your admission symptoms, develop shortness of breath, life threatening emergency, suicidal or homicidal thoughts you must seek medical attention immediately by calling 911 or calling your MD immediately  if symptoms less severe.  You Must read complete instructions/literature along with all the possible adverse reactions/side effects for all the Medicines you take and that have been prescribed to you. Take any new Medicines after you have completely understood and accpet all the possible adverse reactions/side effects.   Please note  You were cared for by a hospitalist during your hospital stay. If you have any questions about your discharge medications or the care you received while you were in the hospital after you are discharged, you can call the unit and asked to speak with the hospitalist on call if the hospitalist that took care of you is not available. Once you are discharged, your primary care physician will handle any further medical issues. Please note that NO REFILLS for any discharge medications will be authorized once you are discharged, as it is imperative that you return to your primary care physician (or establish a relationship with a primary care physician if you do not have one) for your aftercare needs so that they can reassess your need for medications and monitor your lab values.     Today   Abdominal pain much improved since admission, patient denies any fevers  chills or nausea vomiting.  Will discharge home today.  VITAL SIGNS:  Blood pressure 112/84, pulse 77, temperature 98.1 F (36.7 C), temperature source Oral, resp. rate 20, height 5\' 10"  (1.778 m), weight 96.6 kg (213 lb), SpO2 97 %.  I/O:    Intake/Output Summary (Last 24 hours) at 04/11/2018 1555 Last data filed at 04/11/2018 1016 Gross per 24 hour  Intake 600 ml  Output 300 ml  Net 300 ml    PHYSICAL EXAMINATION:  GENERAL:  52 y.o.-year-old patient lying in the bed with no acute distress.  EYES: Pupils equal, round, reactive to light and accommodation. No scleral icterus. Extraocular muscles intact.  HEENT: Head atraumatic, normocephalic. Oropharynx and nasopharynx clear.  NECK:  Supple, no jugular venous distention. No thyroid enlargement, no tenderness.  LUNGS: Normal breath sounds bilaterally, no wheezing, rales,rhonchi. No use of accessory muscles of respiration.  CARDIOVASCULAR: S1, S2 normal. No murmurs, rubs, or gallops.  ABDOMEN: Soft, non-tender, non-distended. Bowel sounds present. No organomegaly or mass.  EXTREMITIES:  No pedal edema, cyanosis, or clubbing.  NEUROLOGIC: Cranial nerves II through XII are intact. No focal motor or sensory defecits b/l.  PSYCHIATRIC: The patient is alert and oriented x 3. Good affect.  SKIN: No obvious rash, lesion, or ulcer.   DATA REVIEW:   CBC Recent Labs  Lab 04/11/18 0306  WBC 5.5  HGB 13.9  HCT 39.5*  PLT 149*    Chemistries  Recent Labs  Lab 04/09/18 0302 04/10/18 0309  NA 138 138  K 3.8 4.0  CL 106 103  CO2 27 29  GLUCOSE 114* 120*  BUN 21* 19  CREATININE 1.15 1.25*  CALCIUM 8.3* 8.6*  AST 27  --   ALT 22  --   ALKPHOS 54  --   BILITOT 0.6  --     Cardiac Enzymes Recent Labs  Lab 04/05/18 1726  TROPONINI 0.03*    Microbiology Results  No results found for this or any previous visit.  RADIOLOGY:  No results found.    Management plans discussed with the patient, family and they are in  agreement.  CODE STATUS:  Code Status History    Date Active Date Inactive Code Status Order ID Comments User Context   04/08/2018 2245 04/11/2018 1353 Full Code [id number]  Salary, Evelena AsaMontell D, MD Inpatient  Advance Directive Documentation     Most Recent Value  Type of Advance Directive  Living will  Pre-existing out of facility DNR order (yellow form or pink MOST form)  -  "MOST" Form in Place?  -      TOTAL TIME TAKING CARE OF THIS PATIENT: 40 minutes.    [place] SirenSAINANI,Nishat Livingston J M.D on 04/11/2018 at 3:55 PM  Between 7am to 6pm - Pager - [phone]  After 6pm go to [email or web address] - Social research officer, governmentpassword [email or web address]  [facility]  Office  [phone]  CC: Primary care physician; Marisue IvanLinthavong, Kanhka, MD

## 2018-04-11 NOTE — Progress Notes (Signed)
CC: Adrenal Hemorrhage   Subjective: Hb stable, feeling well, taking PO, + BM  Objective: Vital signs in last 24 hours: Temp:  [98.1 F (36.7 C)-98.5 F (36.9 C)] 98.1 F (36.7 C) (08/02 0500) Pulse Rate:  [77-80] 77 (08/02 0500) Resp:  [14-20] 20 (08/02 0500) BP: (112-143)/(84-99) 112/84 (08/02 0500) SpO2:  [95 %-97 %] 97 % (08/02 0500) Last BM Date: 04/09/18  Intake/Output from previous day: 08/01 0701 - 08/02 0700 In: 598 [P.O.:480; I.V.:118] Out: 1350 [Urine:1350] Intake/Output this shift: No intake/output data recorded.  Physical exam:  NAD, alert Abd: soft, nt, no peritonitis Ext: no edema and well perfused Nuero: GCS 15, no motor or sens deficits  Lab Results: CBC  Recent Labs    04/10/18 0309 04/11/18 0306  WBC 6.6 5.5  HGB 13.8 13.9  HCT 39.6* 39.5*  PLT 146* 149*   BMET Recent Labs    04/09/18 0302 04/10/18 0309  NA 138 138  K 3.8 4.0  CL 106 103  CO2 27 29  GLUCOSE 114* 120*  BUN 21* 19  CREATININE 1.15 1.25*  CALCIUM 8.3* 8.6*   PT/INR Recent Labs    04/09/18 1429  LABPROT 13.3  INR 1.02   ABG No results for input(s): PHART, HCO3 in the last 72 hours.  Invalid input(s): PCO2, PO2  Studies/Results: Ct Chest W Contrast  Result Date: 04/09/2018 CLINICAL DATA:  Adrenal hemorrhage EXAM: CT CHEST WITH CONTRAST TECHNIQUE: Multidetector CT imaging of the chest was performed during intravenous contrast administration. CONTRAST:  75mL OMNIPAQUE IOHEXOL 300 MG/ML  SOLN COMPARISON:  CT chest 04/15/2017 FINDINGS: Cardiovascular: The heart size is normal. No substantial pericardial effusion. Ascending thoracic aorta measures 4.1 cm diameter. Mediastinum/Nodes: No mediastinal lymphadenopathy. There is no hilar lymphadenopathy. The esophagus has normal imaging features. There is no axillary lymphadenopathy. Lungs/Pleura: Right middle and lower lobe subsegmental no focal airspace consolidation. No pulmonary edema or pleural effusion. Atelectasis noted  in the right base. Upper Abdomen: Right adrenal mass with stranding in the right retroperitoneal space better characterized on abdominal CT yesterday. Musculoskeletal: No worrisome lytic or sclerotic osseous abnormality. IMPRESSION: 1. Asymmetric elevation right hemidiaphragm with subsegmental atelectasis at the right base. 2. Ascending thoracic aorta measures 4.1 cm diameter. Recommend annual imaging followup by CTA or MRA. This recommendation follows 2010 ACCF/AHA/AATS/ACR/ASA/SCA/SCAI/SIR/STS/SVM Guidelines for the Diagnosis and Management of Patients with Thoracic Aortic Disease. Circulation. 2010; 121: Z610-R604: [id number] Electronically Signed   By: Kennith CenterEric  Mansell M.D.   On: 04/09/2018 17:30    Anti-infectives: Anti-infectives (From admission, onward)   Start     Dose/Rate Route Frequency Ordered Stop   04/08/18 2300  valACYclovir (VALTREX) tablet 1,000 mg  Status:  Discontinued     1,000 mg Oral 3 times daily 04/08/18 2245 04/09/18 1354      Assessment/Plan:  Adrenal hemorrhage Hb stable, no need for surgical intervention F/U 2-3 weeks    Sterling Bigiego Aubriee Szeto, MD, FACS  04/11/2018

## 2018-04-11 NOTE — Telephone Encounter (Signed)
Please schedule to see on on Tuesday in [place] on 8/06 at 9:15am; with CBC/bmp.  Thx

## 2018-04-11 NOTE — Progress Notes (Signed)
Alexander Mcobert W Grossberg Jr.   DOB:08/23/1966   UJ#:[id number]    Subjective: Patient continues received IV narcotic pain medication yesterday for abdominal pain/  Flank.   Review of system appetite is good.  No constipation or diarrhea.  Objective:  Vitals:   04/10/18 2019 04/11/18 0500  BP: (!) 143/99 112/84  Pulse: 80 77  Resp: 20 20  Temp: 98.5 F (36.9 C) 98.1 F (36.7 C)  SpO2: 95% 97%    No intake or output data in the 24 hours ending 04/13/18 2155  GENERAL Alert, no distress and comfortable.  Accompanied by his wife. EYES: no pallor or icterus OROPHARYNX: no thrush or ulceration. NECK: supple, no masses felt LYMPH:  no palpable lymphadenopathy in the cervical, axillary or inguinal regions LUNGS: decreased breath sounds to auscultation at bases and  No wheeze or crackles HEART/CVS: regular rate & rhythm and no murmurs; No lower extremity edema ABDOMEN: abdomen soft, tender  on deep palpation. and normal bowel sounds Musculoskeletal:no cyanosis of digits and no clubbing  PSYCH: alert & oriented x 3 with fluent speech NEURO: no focal motor/sensory deficits SKIN:  no rashes or significant lesions   Labs:  Lab Results  Component Value Date   WBC 5.5 04/11/2018   HGB 13.9 04/11/2018   HCT 39.5 (L) 04/11/2018   MCV 93.6 04/11/2018   PLT 149 (L) 04/11/2018   NEUTROABS 3.6 05/21/2017    Lab Results  Component Value Date   NA 138 04/10/2018   K 4.0 04/10/2018   CL 103 04/10/2018   CO2 29 04/10/2018    Studies:  No results found.  Assessment & Plan:   # 52 year old male patient history of recurrent DVT/PE-on Eliquis; suspected antiphospholipid antibody syndrome that is currently admitted the hospital for right flank abdominal pain/adrenal hemorrhage.  # Right adrenal hemorrhage/abdominal pain-likely secondary to antiphospholipid antibody syndrome.  Patient currently restarted back on Lovenox; tolerating well no obvious evidence of any progressive hemorrhage. Hemoglobin is  stable.  Discussed regarding pain control patient is not interested in narcotics [prior history of alcoholism/drug addiction]; he wants to try tramadol/Tylenol which is reasonable.  # Recurrent DVT/PE-antiphospholipid antibody syndrome is confirmed on repeat testing.  Reviewed the results with the patient/wife.  Patient will need lifelong anticoagulation.  For now I would recommend at least 1 month of Lovenox and then in clinically stable; switch over to Coumadin.   #The above plan of care was discussed with the patient/wife in detail.  Also discussed with Dr. Cherlynn KaiserSainani.  Patient could potentially be discharged home today.  Patient follow-up with me in the clinic on August 6 in [place]  for follow-up  Earna CoderGovinda R Kairee Kozma, MD

## 2018-04-11 NOTE — Telephone Encounter (Signed)
Called in script for lovenox; will check the price; also discussed with RN; will check Stephanie/social worker.

## 2018-04-11 NOTE — Telephone Encounter (Signed)
I called pt and he will be her @ 9 am for lab and then see MD

## 2018-04-11 NOTE — Telephone Encounter (Signed)
I called [facility] to check price.  Patient has a $50 copay for generic Lovenox.  Alexander [name] [facility] Phone [phone] Fax [phone] 04/11/2018 10:46 AM

## 2018-04-14 DIAGNOSIS — E2749 Other adrenocortical insufficiency: Secondary | ICD-10-CM | POA: Diagnosis not present

## 2018-04-15 ENCOUNTER — Other Ambulatory Visit: Payer: Self-pay

## 2018-04-15 ENCOUNTER — Encounter: Payer: Self-pay | Admitting: Internal Medicine

## 2018-04-15 ENCOUNTER — Ambulatory Visit
Admission: RE | Admit: 2018-04-15 | Discharge: 2018-04-15 | Disposition: A | Discharge status: Home / Self Care | Payer: 59 | Source: Ambulatory Visit | Attending: Internal Medicine | Admitting: Internal Medicine

## 2018-04-15 ENCOUNTER — Inpatient Hospital Stay: Payer: 59 | Attending: Internal Medicine | Admitting: Internal Medicine

## 2018-04-15 ENCOUNTER — Inpatient Hospital Stay: Payer: 59

## 2018-04-15 VITALS — BP 129/89 | HR 96 | Temp 96.1°F | Resp 18

## 2018-04-15 DIAGNOSIS — Z7901 Long term (current) use of anticoagulants: Secondary | ICD-10-CM | POA: Diagnosis not present

## 2018-04-15 DIAGNOSIS — D6861 Antiphospholipid syndrome: Secondary | ICD-10-CM | POA: Insufficient documentation

## 2018-04-15 DIAGNOSIS — E2749 Other adrenocortical insufficiency: Secondary | ICD-10-CM | POA: Diagnosis not present

## 2018-04-15 DIAGNOSIS — R1032 Left lower quadrant pain: Secondary | ICD-10-CM | POA: Diagnosis not present

## 2018-04-15 DIAGNOSIS — I824Z2 Acute embolism and thrombosis of unspecified deep veins of left distal lower extremity: Secondary | ICD-10-CM | POA: Insufficient documentation

## 2018-04-15 DIAGNOSIS — R112 Nausea with vomiting, unspecified: Secondary | ICD-10-CM

## 2018-04-15 DIAGNOSIS — R109 Unspecified abdominal pain: Secondary | ICD-10-CM | POA: Diagnosis not present

## 2018-04-15 DIAGNOSIS — Z452 Encounter for adjustment and management of vascular access device: Secondary | ICD-10-CM | POA: Diagnosis not present

## 2018-04-15 DIAGNOSIS — R519 Headache, unspecified: Secondary | ICD-10-CM

## 2018-04-15 DIAGNOSIS — D8982 Autoimmune lymphoproliferative syndrome [ALPS]: Secondary | ICD-10-CM | POA: Diagnosis not present

## 2018-04-15 DIAGNOSIS — R1084 Generalized abdominal pain: Secondary | ICD-10-CM | POA: Diagnosis not present

## 2018-04-15 DIAGNOSIS — R21 Rash and other nonspecific skin eruption: Secondary | ICD-10-CM | POA: Diagnosis not present

## 2018-04-15 DIAGNOSIS — S37812D Contusion of adrenal gland, subsequent encounter: Secondary | ICD-10-CM

## 2018-04-15 DIAGNOSIS — R1033 Periumbilical pain: Secondary | ICD-10-CM

## 2018-04-15 DIAGNOSIS — N179 Acute kidney failure, unspecified: Secondary | ICD-10-CM | POA: Diagnosis not present

## 2018-04-15 DIAGNOSIS — Z86718 Personal history of other venous thrombosis and embolism: Secondary | ICD-10-CM | POA: Insufficient documentation

## 2018-04-15 DIAGNOSIS — X58XXXD Exposure to other specified factors, subsequent encounter: Secondary | ICD-10-CM | POA: Diagnosis not present

## 2018-04-15 DIAGNOSIS — R51 Headache: Secondary | ICD-10-CM

## 2018-04-15 DIAGNOSIS — R000 Tachycardia, unspecified: Secondary | ICD-10-CM | POA: Diagnosis not present

## 2018-04-15 DIAGNOSIS — D68312 Antiphospholipid antibody with hemorrhagic disorder: Secondary | ICD-10-CM | POA: Diagnosis not present

## 2018-04-15 DIAGNOSIS — R1011 Right upper quadrant pain: Secondary | ICD-10-CM | POA: Diagnosis not present

## 2018-04-15 DIAGNOSIS — D696 Thrombocytopenia, unspecified: Secondary | ICD-10-CM | POA: Diagnosis not present

## 2018-04-15 DIAGNOSIS — D7582 Heparin induced thrombocytopenia (HIT): Secondary | ICD-10-CM | POA: Diagnosis not present

## 2018-04-15 DIAGNOSIS — R1031 Right lower quadrant pain: Secondary | ICD-10-CM | POA: Diagnosis not present

## 2018-04-15 DIAGNOSIS — I440 Atrioventricular block, first degree: Secondary | ICD-10-CM | POA: Diagnosis not present

## 2018-04-15 DIAGNOSIS — D6859 Other primary thrombophilia: Secondary | ICD-10-CM

## 2018-04-15 DIAGNOSIS — E278 Other specified disorders of adrenal gland: Secondary | ICD-10-CM | POA: Diagnosis not present

## 2018-04-15 LAB — CBC WITH DIFFERENTIAL/PLATELET
BASOS ABS: 0.1 10*3/uL (ref 0–0.1)
BASOS PCT: 1 %
EOS PCT: 0 %
Eosinophils Absolute: 0 10*3/uL (ref 0–0.7)
HCT: 41.4 % (ref 40.0–52.0)
HEMOGLOBIN: 14.3 g/dL (ref 13.0–18.0)
Lymphocytes Relative: 15 %
Lymphs Abs: 1.3 10*3/uL (ref 1.0–3.6)
MCH: 32.2 pg (ref 26.0–34.0)
MCHC: 34.4 g/dL (ref 32.0–36.0)
MCV: 93.4 fL (ref 80.0–100.0)
Monocytes Absolute: 1.1 10*3/uL — ABNORMAL HIGH (ref 0.2–1.0)
Monocytes Relative: 13 %
NEUTROS PCT: 71 %
Neutro Abs: 6.5 10*3/uL (ref 1.4–6.5)
PLATELETS: 191 10*3/uL (ref 150–440)
RBC: 4.43 MIL/uL (ref 4.40–5.90)
RDW: 12.3 % (ref 11.5–14.5)
WBC: 9 10*3/uL (ref 3.8–10.6)

## 2018-04-15 LAB — BASIC METABOLIC PANEL
Anion gap: 11 (ref 5–15)
BUN: 19 mg/dL (ref 6–20)
CALCIUM: 8.9 mg/dL (ref 8.9–10.3)
CHLORIDE: 100 mmol/L (ref 98–111)
CO2: 25 mmol/L (ref 22–32)
CREATININE: 1.23 mg/dL (ref 0.61–1.24)
Glucose, Bld: 128 mg/dL — ABNORMAL HIGH (ref 70–99)
Potassium: 4.1 mmol/L (ref 3.5–5.1)
SODIUM: 136 mmol/L (ref 135–145)

## 2018-04-15 MED ORDER — OXYCODONE HCL 5 MG PO TABS
10.0000 mg | ORAL_TABLET | Freq: Once | ORAL | Status: AC
  Administered 2018-04-15: 10 mg via ORAL
  Filled 2018-04-15: qty 2

## 2018-04-15 MED ORDER — ONDANSETRON HCL 4 MG/2ML IJ SOLN
8.0000 mg | Freq: Once | INTRAMUSCULAR | Status: AC
  Administered 2018-04-15: 8 mg via INTRAVENOUS

## 2018-04-15 MED ORDER — SODIUM CHLORIDE 0.9 % IV SOLN
INTRAVENOUS | Status: DC
  Administered 2018-04-15: 13:00:00 via INTRAVENOUS
  Filled 2018-04-15 (×2): qty 1000

## 2018-04-15 MED ORDER — ONDANSETRON HCL 4 MG PO TABS
4.0000 mg | ORAL_TABLET | Freq: Once | ORAL | Status: AC
  Administered 2018-04-15: 4 mg via ORAL
  Filled 2018-04-15: qty 1

## 2018-04-15 MED ORDER — ACETAMINOPHEN 325 MG PO TABS
650.0000 mg | ORAL_TABLET | Freq: Once | ORAL | Status: AC
  Administered 2018-04-15: 650 mg via ORAL

## 2018-04-15 MED ORDER — IOPAMIDOL (ISOVUE-300) INJECTION 61%
100.0000 mL | Freq: Once | INTRAVENOUS | Status: AC | PRN
  Administered 2018-04-15: 100 mL via INTRAVENOUS

## 2018-04-15 NOTE — Progress Notes (Addendum)
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   # AUG 30th 2018- Left LE DVT [ DVT completely occluding the popliteal, posterior tibial and peroneal veins. On eliquis ? SEP 2018- ANTI-PHOSPHOLIPID ANTIBODY PANEL-ANTIBODIES-POSITIVE; repeat work up-pending [NEG- factor V Leiden/Prothrombin gene/Protein C&S/Anti-thrombin III]  # MILD THROMBOCYTOPENIA [90-130s; at least since July 2017]    No history exists.   HISTORY OF PRESENTING ILLNESS:  Alexander [name]. 52 y.o.  male with a history of recurrent DVT; And history of PE-currently on Eliquis.  He is here for a hospital follow-up.  Most recently patient was admitted to the hospital for worsening right abdominal pain/flank pain-noted to have a right adrenal hemorrhage; also inflammation of the small bowel/ileus.  Initially patient's Eliquis was held-and patient-was started back on Lovenox 1 mg/kg twice a day.  Patient did not have a significant drop of hemoglobin.  At the time of discharge patient abdominal pain is improved; and was having good bowel movements.  However starting yesterday patient noted to have worsening right flank pain.  He calls a 10 on a scale of 10 radiating to his right  groin.  Worse with movement.  Positive for nausea no vomiting.  Poor appetite.  Positive for constipation last bowel movement was approximately 2 days ago.  No blood in stools black or stools.  ROS: A complete 10 point review of system is done which is negative except mentioned above in history of present illness  MEDICAL HISTORY:  Past Medical History:  Diagnosis Date  . DVT (deep venous thrombosis) (HCC)   . History of pulmonary embolus (PE)     SURGICAL HISTORY: Past Surgical History:  Procedure Laterality Date  . NASAL FRACTURE SURGERY    . VASECTOMY      SOCIAL HISTORY: Patient has 2 younger  Brothers; son/31; daughter-28; NO smoking; Prior heavy drinking - 2000; current abstinent. He owns a sign buisness.  Social History   Socioeconomic History  . Marital status: Married    Spouse name: Not on file  . Number of children: Not on file  . Years of education: Not on file  . Highest education level: Not on file  Occupational History  . Not on file  Social Needs  . Financial resource strain: Not on file  . Food insecurity:    Worry: Not on file    Inability: Not on file  . Transportation needs:    Medical: Not on file    Non-medical: Not on file  Tobacco Use  . Smoking status: Never Smoker  . Smokeless tobacco: Never Used  Substance and Sexual Activity  . 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 Drug use: No  . Sexual activity: Not on file  Lifestyle  . Physical activity:    Days per week: Not on file    Minutes per session: Not on file  . Stress: Not on file  Relationships  . Social connections:    Talks on phone: Not on file    Gets  together: Not on file    Attends religious service: Not on file    Active member of club or organization: Not on file    Attends meetings of clubs or organizations: Not on file    Relationship status: Not on file  . Intimate partner violence:    Fear of current or ex partner: Not on file    Emotionally  abused: Not on file    Physically abused: Not on file    Forced sexual activity: Not on file  Other Topics Concern  . Not on file  Social History Narrative  . Not on file    FAMILY HISTORY: Family History  Problem Relation Age of Onset  . Heart attack Father   . Stroke Father   . Cancer Father 11       Prostate &/or bladder    ALLERGIES:  is allergic to codeine; oxycodone-acetaminophen; and tramadol.  MEDICATIONS:  Current Outpatient Medications  Medication Sig Dispense Refill  . dicyclomine (BENTYL) 20 MG tablet Take 1 tablet (20 mg total) by mouth 2 (two) times daily as needed for spasms. 20 tablet 0  . docusate sodium (COLACE) 100 MG capsule Take 1 capsule (100 mg total) by mouth every 12 (twelve) hours. 60 capsule 0  . enoxaparin (LOVENOX) 100 MG/ML injection Inject 1 mL (100 mg total) into the skin every 12 (twelve) hours. 60 Syringe 1  . ondansetron (ZOFRAN) 4 MG tablet Take 1 tablet (4 mg total) by mouth every 6 (six) hours as needed for nausea or vomiting. 12 tablet 0  . polyethylene glycol (MIRALAX / GLYCOLAX) packet Take 17 g by mouth daily. (Patient taking differently: Take 17 g by mouth daily as needed for mild constipation. ) 14 each 0  . traMADol (ULTRAM) 50 MG tablet Take 1 tablet (50 mg total) by mouth every 6 (six) hours as needed. 30 tablet 0   No current facility-administered medications for this visit.    Facility-Administered Medications Ordered in Other Visits  Medication Dose Route Frequency Provider Last Rate Last Dose  . 0.9 %  sodium chloride infusion   Intravenous Continuous Earna Coder, MD 999 mL/hr at 04/15/18 1240    . oxyCODONE (Oxy IR/ROXICODONE) immediate release tablet 10 mg  10 mg Oral Once Earna Coder, MD          .  PHYSICAL EXAMINATION: ECOG PERFORMANCE STATUS: 0 - Asymptomatic  Vitals:   04/15/18 0901 04/15/18 0915  BP: 130/85 129/89  Pulse: 85 96  Resp: 18   Temp: (!) 96.1 F (35.6 C)    There were no  vitals filed for this visit.  GENERAL: Well-nourished well-developed; Alert, moderate to severe distress and comfortable.   Accompanied by his wife.  EYES: no pallor or icterus OROPHARYNX: no thrush or ulceration; good dentition  NECK: supple, no masses felt LYMPH:  no palpable lymphadenopathy in the cervical, axillary or inguinal regions LUNGS: clear to auscultation and  No wheeze or crackles HEART/CVS: regular rate & rhythm and no murmurs; No lower extremity edema; he is wearing right lower extremity stocking.  ABDOMEN: abdomen soft, right flank tenderness; no rigidity or guarding and normal bowel sounds Musculoskeletal:no cyanosis of digits and no clubbing  PSYCH: alert & oriented x 3 with fluent speech NEURO: no focal motor/sensory deficits SKIN:  no rashes or significant lesions  LABORATORY DATA:  I have reviewed the data as listed Lab Results  Component Value Date   WBC 9.0 04/15/2018   HGB 14.3 04/15/2018  HCT 41.4 04/15/2018   MCV 93.4 04/15/2018   PLT 191 04/15/2018   Recent Labs    04/05/18 1726 04/06/18 1756 04/08/18 1332 04/09/18 0302 04/10/18 0309 04/15/18 0909  NA 142 139 137 138 138 136  K 3.8 3.6 4.5 3.8 4.0 4.1  CL 107 105 101 106 103 100  CO2 28 24 29 27 29 25   GLUCOSE 122* 148* 107* 114* 120* 128*  BUN 20 11 20  21* 19 19  CREATININE 1.40* 1.15 1.16 1.15 1.25* 1.23  CALCIUM 9.3 9.5 9.5 8.3* 8.6* 8.9  GFRNONAA 57* >60 >60 >60 >60 >60  GFRAA >60 >60 >60 >60 >60 >60  PROT 6.9 7.6 8.5* 6.6  --   --   ALBUMIN 4.3 4.5 5.0 3.8  --   --   AST 28 22 34 27  --   --   ALT 26 24 22 22   --   --   ALKPHOS 48 61 66 54  --   --   BILITOT 0.7 0.9 1.3* 0.6  --   --   BILIDIR <0.1  --   --   --   --   --   IBILI NOT CALCULATED  --   --   --   --   --     RADIOGRAPHIC STUDIES: I have personally reviewed the radiological images as listed and agreed with the findings in the report. Dg Chest 2 View  Result Date: 04/05/2018 CLINICAL DATA:  Lambert ModySharp right-sided rib  pain. History of pulmonary embolus. EXAM: CHEST - 2 VIEW COMPARISON:  July 06, 2016 FINDINGS: The heart size and mediastinal contours are within normal limits. Both lungs are clear. The visualized skeletal structures are unremarkable. IMPRESSION: No active cardiopulmonary disease. Electronically Signed   By: Gerome Samavid  Williams III M.D   On: 04/05/2018 17:50   Ct Chest W Contrast  Result Date: 04/09/2018 CLINICAL DATA:  Adrenal hemorrhage EXAM: CT CHEST WITH CONTRAST TECHNIQUE: Multidetector CT imaging of the chest was performed during intravenous contrast administration. CONTRAST:  75mL OMNIPAQUE IOHEXOL 300 MG/ML  SOLN COMPARISON:  CT chest 04/15/2017 FINDINGS: Cardiovascular: The heart size is normal. No substantial pericardial effusion. Ascending thoracic aorta measures 4.1 cm diameter. Mediastinum/Nodes: No mediastinal lymphadenopathy. There is no hilar lymphadenopathy. The esophagus has normal imaging features. There is no axillary lymphadenopathy. Lungs/Pleura: Right middle and lower lobe subsegmental no focal airspace consolidation. No pulmonary edema or pleural effusion. Atelectasis noted in the right base. Upper Abdomen: Right adrenal mass with stranding in the right retroperitoneal space better characterized on abdominal CT yesterday. Musculoskeletal: No worrisome lytic or sclerotic osseous abnormality. IMPRESSION: 1. Asymmetric elevation right hemidiaphragm with subsegmental atelectasis at the right base. 2. Ascending thoracic aorta measures 4.1 cm diameter. Recommend annual imaging followup by CTA or MRA. This recommendation follows 2010 ACCF/AHA/AATS/ACR/ASA/SCA/SCAI/SIR/STS/SVM Guidelines for the Diagnosis and Management of Patients with Thoracic Aortic Disease. Circulation. 2010; 121: B284-X324: [id number] Electronically Signed   By: Kennith CenterEric  Mansell M.D.   On: 04/09/2018 17:30   Ct Abdomen Pelvis W Contrast  Result Date: 04/15/2018 CLINICAL DATA:  Acute abdominal pain. EXAM: CT ABDOMEN AND PELVIS WITH  CONTRAST TECHNIQUE: Multidetector CT imaging of the abdomen and pelvis was performed using the standard protocol following bolus administration of intravenous contrast. CONTRAST:  100mL ISOVUE-300 IOPAMIDOL (ISOVUE-300) INJECTION 61% COMPARISON:  04/08/2018 FINDINGS: Lower chest: No acute abnormality. Hepatobiliary: No focal liver abnormality is seen. No gallstones, gallbladder wall thickening, or biliary dilatation. Pancreas: Unremarkable. No pancreatic ductal dilatation or surrounding inflammatory changes.  Spleen: Normal in size without focal abnormality. Adrenals/Urinary Tract: The right adrenal gland hyperdense mass measures 4.6 by 3.5 cm, image 27/2. This is compared with 3.6 by 2.9 cm on the previous exam. Mild surrounding soft tissue stranding is identified. Normal appearance of the left adrenal gland. Both kidneys are unremarkable. Stomach/Bowel: The stomach appears normal. The small bowel loops have a normal course and caliber. The appendix is visualized and appears normal. Scattered colonic diverticula noted without acute inflammation. Vascular/Lymphatic: Normal appearance of the abdominal aorta. Portal vein and hepatic veins appear patent. There is mild soft tissue stranding within the lower retroperitoneum extending into the extraperitoneal space of both sides of pelvis. No enlarged lymph nodes identified. Reproductive: Mild prostate gland enlargement. Other: No free fluid or fluid collections identified. Fat containing periumbilical hernia identified. Musculoskeletal: No acute or significant osseous findings. IMPRESSION: 1. Again seen is a large hyperdense mass involving the right adrenal gland which has increased in size from 04/08/2018 and is new from 04/05/2018. This is favored to represent adrenal hemorrhage of uncertain etiology. There is mild soft tissue stranding within the retroperitoneum extending into the bilateral extraperitoneal space of the pelvis compatible with hemorrhage. 2. No new extra  adrenal abnormalities identified. No evidence for ileus or bowel obstruction. Electronically Signed   By: Signa Kell M.D.   On: 04/15/2018 12:10   Ct Abdomen Pelvis W Contrast  Result Date: 04/08/2018 CLINICAL DATA:  Generalized abdominal pain with possible ileus or obstruction. Last bowel movement 3 days ago. Patient not passing gas. EXAM: CT ABDOMEN AND PELVIS WITH CONTRAST TECHNIQUE: Multidetector CT imaging of the abdomen and pelvis was performed using the standard protocol following bolus administration of intravenous contrast. CONTRAST:  ISOVUE-370 IOPAMIDOL (ISOVUE-370) INJECTION 76% COMPARISON:  [facility] April 08, 2018.  CT scan April 05, 2018. FINDINGS: Lower chest: Mild atelectasis in the right lung. No other abnormalities. Hepatobiliary: Hepatic steatosis. No focal liver lesions or masses. The gallbladder is normal. Portal vein is patent. Pancreas: Unremarkable. No pancreatic ductal dilatation or surrounding inflammatory changes. Spleen: Normal in size without focal abnormality. Adrenals/Urinary Tract: The left adrenal gland is normal. The right adrenal gland has enlarged in the interval with marked adjacent fat stranding. The attenuation of the right adrenal gland is 61 Hounsfield units. It is now rounded measuring 4.0 by 3.0 by 4.8 cm. Kidneys are normal in appearance with no hydronephrosis. The ureters and bladder are normal. Stomach/Bowel: The stomach is normal without distention. There are several clustered loops of mildly dilated jejunum in the left upper quadrant measuring up to 3.3 cm. The remainder of the small bowel is normal in caliber. Scattered colonic diverticuli are noted. No diverticulitis. There is fluid throughout most of the colon with air-fluid levels. No wall thickening. The appendix is normal. Vascular/Lymphatic: The abdominal aorta is normal in caliber without aneurysm or dissection. No atherosclerotic change. No adenopathy. Reproductive: Prostate calcifications. Other: No  free air. There is a fat containing periumbilical hernia. There is a small amount of free fluid in the right upper quadrant with no fluid collection. Musculoskeletal: No acute or significant osseous findings. IMPRESSION: 1. The right adrenal gland is now rounded and masslike with an attenuation of 60 Hounsfield units, new in the interval. There is significant adjacent fat stranding. The findings are most consistent with an adrenal hemorrhage of uncertain etiology. This is likely the cause of the patient's right-sided pain. 2. There are a few adjacent mildly dilated loops of jejunum in the left upper quadrant. The remainder  of the small bowel loops are normal. The colon is fluid-filled along much of its length. This constellation of findings most likely represents ileus or sequela of enteritis with diarrhea. Recommend clinical correlation and close follow-up. 3. No other abnormalities. Electronically Signed   By: Gerome Sam III M.D   On: 04/08/2018 17:50   Ct Abdomen Pelvis W Contrast  Result Date: 04/05/2018 CLINICAL DATA:  Right-sided abdominal pain EXAM: CT ABDOMEN AND PELVIS WITH CONTRAST TECHNIQUE: Multidetector CT imaging of the abdomen and pelvis was performed using the standard protocol following bolus administration of intravenous contrast. CONTRAST:  ISOVUE-300 IOPAMIDOL (ISOVUE-300) INJECTION 61% COMPARISON:  CT 11/05/2010, ultrasound 04/05/2018 FINDINGS: Lower chest: Lung bases demonstrate no acute consolidation or effusion. The heart size is within normal limits Hepatobiliary: No focal liver abnormality is seen. No gallstones, gallbladder wall thickening, or biliary dilatation. Pancreas: Unremarkable. No pancreatic ductal dilatation or surrounding inflammatory changes. Spleen: Normal in size without focal abnormality. Adrenals/Urinary Tract: Adrenal glands are unremarkable. Kidneys are normal, without renal calculi, focal lesion, or hydronephrosis. Bladder is unremarkable. Stomach/Bowel:  Stomach is within normal limits. Appendix appears normal. No evidence of bowel wall thickening, distention, or inflammatory changes. Large stool in the cecum. Sigmoid colon diverticular disease without acute inflammation Vascular/Lymphatic: No significant vascular findings are present. No enlarged abdominal or pelvic lymph nodes. Reproductive: Enlarged prostate with calcification Other: Negative for free air or free fluid. Musculoskeletal: No acute or significant osseous findings. IMPRESSION: 1. No CT evidence for acute intra-abdominal or pelvic abnormality. 2. Sigmoid colon diverticular disease without acute inflammation 3. Prostatomegaly Electronically Signed   By: Jasmine Pang M.D.   On: 04/05/2018 22:15   US Renal  Result Date: 04/06/2018 CLINICAL DATA:  Right flank pain EXAM: RENAL / URINARY TRACT ULTRASOUND COMPLETE COMPARISON:  None. FINDINGS: Right Kidney: Length: 9.8 cm. Echogenicity within normal limits. No mass or hydronephrosis visualized. Left Kidney: Length: 11.3 cm. Echogenicity within normal limits. No mass or hydronephrosis visualized. Bladder: Appears normal for degree of bladder distention. IMPRESSION: No acute findings.  No hydronephrosis. Electronically Signed   By: Charlett Nose M.D.   On: 04/06/2018 20:06   Dg Abd 2 Views  Result Date: 04/08/2018 CLINICAL DATA:  Right side abdominal pain since 04/05/2018. EXAM: ABDOMEN - 2 VIEW COMPARISON:  CT abdomen and pelvis scratch the CT chest, CT abdomen and pelvis 04/05/2018. FINDINGS: No free intraperitoneal air is identified. Air-fluid levels are seen in both small and large bowel. The bowel gas pattern is not typical of obstruction. No abnormal abdominal calcification is identified. No acute or focal bony abnormality. IMPRESSION: Nonobstructive bowel gas pattern with air-fluid levels in both small and large bowel suggestive of enteritis and diarrhea. Electronically Signed   By: Drusilla Kanner M.D.   On: 04/08/2018 12:03   US Abdomen  Limited Ruq  Result Date: 04/05/2018 CLINICAL DATA:  Abdominal pain EXAM: ULTRASOUND ABDOMEN LIMITED RIGHT UPPER QUADRANT COMPARISON:  04/14/2013 FINDINGS: Gallbladder: No gallstones or wall thickening visualized. No sonographic Murphy sign noted by sonographer. Common bile duct: Diameter: 5 mm in diameter Liver: No focal lesion identified. Within normal limits in parenchymal echogenicity. Portal vein is patent on color Doppler imaging with normal direction of blood flow towards the liver. IMPRESSION: Normal right upper quadrant ultrasound. Electronically Signed   By: Charlett Nose M.D.   On: 04/05/2018 20:32    ASSESSMENT & PLAN:  I think I see her in the next few days or so by Acute deep vein thrombosis (DVT) of distal  vein of left lower extremity (HCC)    Antiphospholipid antibody syndrome (HCC) #Antiphospholipid antibody syndrome-symptomatic; IgM positive; history of recurrent DVT/PE.  Patient needs to be on indefinite anticoagulation.  Patient is currently on Lovenox 100 mg subcu twice a day.  Will eventually plan to transition to Coumadin once acute issues resolved. [See discussion below].  #Right adrenal hemorrhage-likely secondary to thrombotic/bleeding complications of antiphospholipid antibody syndrome.  Patient currently on Lovenox.  Today CBC shows stable hemoglobin at 14.  The etiology of the right flank abdominal pain is unclear -worsening right adrenal hemorrhages less likely as hemoglobin is stable.  Await his stat CT scan as ordered.  Discussed with radiology.  #Right flank abdominal pain-question ileus-constipation/inflammation of the bowel versus adrenal hemorrhage.  Await CT scan as discussed above.  Patient given oxycodone 10 mg/Zofran.   # # 40 minutes face-to-face with the patient discussing the above plan of care; more than 50% of time spent on prognosis/ natural history; counseling and coordination.  Addendum: CT scan today [04/15/2018]-shows increasing size of the right  adrenal hemorrhage [4.5 x 3.5].  Patient's last dose of Lovenox was last night [8/5].  I spoke to Dr. [facility] Key who kindly reviewed the case with me.  He agrees with the current plan-of continuing anticoagulation [heparin].   # I discussed with the patient wife regarding the difficult situation thrombosis/hemorrhage in the context of antiphospholipid antibody syndrome.  They are interested in tertiary care care.  Recommend [facility].  patient will be given a copy of his imaging/this office report/and also labs.   Follow up with me TBD.   All questions were answered. The patient knows to call the clinic with any problems, questions or concerns.    Earna Coder, MD 04/15/2018 1:25 PM

## 2018-04-15 NOTE — Assessment & Plan Note (Addendum)
#  Antiphospholipid antibody syndrome-symptomatic; IgM positive; history of recurrent DVT/PE.  Patient needs to be on indefinite anticoagulation.  Patient is currently on Lovenox 100 mg subcu twice a day.  Will eventually plan to transition to Coumadin once acute issues resolved. [See discussion below].  #Right adrenal hemorrhage-likely secondary to thrombotic/bleeding complications of antiphospholipid antibody syndrome.  Patient currently on Lovenox.  Today CBC shows stable hemoglobin at 14.  The etiology of the right flank abdominal pain is unclear -worsening right adrenal hemorrhages less likely as hemoglobin is stable.  Await his stat CT scan as ordered.  Discussed with radiology.  #Right flank abdominal pain-question ileus-constipation/inflammation of the bowel versus adrenal hemorrhage.  Await CT scan as discussed above.  Patient given oxycodone 10 mg/Zofran.   # # 40 minutes face-to-face with the patient discussing the above plan of care; more than 50% of time spent on prognosis/ natural history; counseling and coordination.  Addendum: CT scan today [04/15/2018]-shows increasing size of the right adrenal hemorrhage [4.5 x 3.5].  Patient's last dose of Lovenox was last night [8/5].  I spoke to Dr. Waldron LabsNigel Key who kindly reviewed the case with me.  He agrees with the current plan-of continuing anticoagulation [heparin].   # I discussed with the patient wife regarding the difficult situation thrombosis/hemorrhage in the context of antiphospholipid antibody syndrome.  They are interested in tertiary care care.  Recommend [facility].  patient will be given a copy of his imaging/this office report/and also labs.   Follow up with me TBD.

## 2018-04-15 NOTE — Progress Notes (Signed)
Patient discharged from clinic with IV access intact in left arm. [facility] vehicle.  Printed hand off of care for today's facility administered medications [facility]. Pt also provided with ct report and images on CD from CT scan today.  IV placed by CT team Peripheral IV 04/15/18 Anterior;Distal;Left;Upper Arm Placement Date/Time: 04/15/18 1131 Size (Gauge): 22 G Orientation: Anterior;Distal;Left;Upper Location: Ar

## 2018-04-15 NOTE — Progress Notes (Signed)
Patient presents to clinic today for routine blood work and f/u for DVT/PE. Lab tech reports that patient" felt dizzy before lab draw." C/o nausea w/o vomiting. No bowel movement in 48 hours -patient had had 2 fleets suppositories; and 2 doses of miralax. - no relief per patient. wife reports that patient was recently dx with adrenal hemorrhage and also with underlying ileus. C/o abdominal pain- cramping, tender at the umbilicus that radiates down to the patient's scrotum/tesicles.

## 2018-04-16 MED ORDER — PROMETHAZINE HCL 25 MG/ML IJ SOLN
12.50 | INTRAMUSCULAR | Status: DC
Start: ? — End: 2018-04-16

## 2018-04-16 MED ORDER — OXYCODONE HCL 5 MG PO TABS
5.00 | ORAL_TABLET | ORAL | Status: DC
Start: ? — End: 2018-04-16

## 2018-04-16 MED ORDER — GENERIC EXTERNAL MEDICATION
Status: DC
Start: ? — End: 2018-04-16

## 2018-04-16 MED ORDER — HEPARIN SODIUM (PORCINE) 1000 UNIT/ML IJ SOLN
5000.00 | INTRAMUSCULAR | Status: DC
Start: ? — End: 2018-04-16

## 2018-04-16 MED ORDER — GENERIC EXTERNAL MEDICATION
1.00 | Status: DC
Start: 2018-04-27 — End: 2018-04-16

## 2018-04-16 MED ORDER — HEPARIN (PORCINE) IN NACL 100-0.45 UNIT/ML-% IJ SOLN
18.00 | INTRAMUSCULAR | Status: DC
Start: ? — End: 2018-04-16

## 2018-04-16 MED ORDER — MORPHINE SULFATE 4 MG/ML IJ SOLN
4.00 | INTRAMUSCULAR | Status: DC
Start: ? — End: 2018-04-16

## 2018-04-16 MED ORDER — POLYETHYLENE GLYCOL 3350 17 G PO PACK
17.00 | PACK | ORAL | Status: DC
Start: 2018-04-20 — End: 2018-04-16

## 2018-04-16 MED ORDER — ACETAMINOPHEN 500 MG PO TABS
1000.00 | ORAL_TABLET | ORAL | Status: DC
Start: 2018-04-27 — End: 2018-04-16

## 2018-04-20 MED ORDER — ENOXAPARIN SODIUM 100 MG/ML ~~LOC~~ SOLN
1.00 | SUBCUTANEOUS | Status: DC
Start: 2018-04-20 — End: 2018-04-20

## 2018-04-20 MED ORDER — GENERIC EXTERNAL MEDICATION
Status: DC
Start: ? — End: 2018-04-20

## 2018-04-20 MED ORDER — NALOXONE HCL 0.4 MG/ML IJ SOLN
0.40 | INTRAMUSCULAR | Status: DC
Start: ? — End: 2018-04-20

## 2018-04-20 MED ORDER — PROMETHAZINE HCL 25 MG/ML IJ SOLN
12.50 | INTRAMUSCULAR | Status: DC
Start: ? — End: 2018-04-20

## 2018-04-20 MED ORDER — PANTOPRAZOLE SODIUM 40 MG PO TBEC
40.00 | DELAYED_RELEASE_TABLET | ORAL | Status: DC
Start: 2018-04-28 — End: 2018-04-20

## 2018-04-20 MED ORDER — ACD-A NOCLOT-50 0.73-2.45-2.2 GM/100ML VI SOLN
750.00 | Status: DC
Start: ? — End: 2018-04-20

## 2018-04-20 MED ORDER — DOCUSATE SODIUM 100 MG PO CAPS
100.00 | ORAL_CAPSULE | ORAL | Status: DC
Start: 2018-04-28 — End: 2018-04-20

## 2018-04-20 MED ORDER — CYCLOBENZAPRINE HCL 10 MG PO TABS
5.00 | ORAL_TABLET | ORAL | Status: DC
Start: ? — End: 2018-04-20

## 2018-04-20 MED ORDER — GENERIC EXTERNAL MEDICATION
200.00 | Status: DC
Start: 2018-04-21 — End: 2018-04-20

## 2018-04-20 MED ORDER — GENERIC EXTERNAL MEDICATION
90.00 | Status: DC
Start: 2018-04-28 — End: 2018-04-20

## 2018-04-20 MED ORDER — HYDROMORPHONE HCL 1 MG/ML IJ SOLN
1.00 | INTRAMUSCULAR | Status: DC
Start: ? — End: 2018-04-20

## 2018-04-20 MED ORDER — GENERIC EXTERNAL MEDICATION
4.00 | Status: DC
Start: ? — End: 2018-04-20

## 2018-04-29 MED ORDER — ACD-A NOCLOT-50 0.73-2.45-2.2 GM/100ML VI SOLN
750.00 | Status: DC
Start: ? — End: 2018-04-29

## 2018-04-29 MED ORDER — FAMOTIDINE 20 MG PO TABS
20.00 | ORAL_TABLET | ORAL | Status: DC
Start: 2018-04-27 — End: 2018-04-29

## 2018-04-29 MED ORDER — POLYETHYLENE GLYCOL 3350 17 G PO PACK
17.00 | PACK | ORAL | Status: DC
Start: ? — End: 2018-04-29

## 2018-04-29 MED ORDER — SULFAMETHOXAZOLE-TRIMETHOPRIM 800-160 MG PO TABS
1.00 | ORAL_TABLET | ORAL | Status: DC
Start: 2018-04-28 — End: 2018-04-29

## 2018-04-29 MED ORDER — TRAZODONE HCL 50 MG PO TABS
50.00 | ORAL_TABLET | ORAL | Status: DC
Start: ? — End: 2018-04-29

## 2018-04-29 MED ORDER — DABIGATRAN ETEXILATE MESYLATE 150 MG PO CAPS
150.00 | ORAL_CAPSULE | ORAL | Status: DC
Start: 2018-04-27 — End: 2018-04-29

## 2018-04-29 MED ORDER — PROMETHAZINE HCL 25 MG PO TABS
25.00 | ORAL_TABLET | ORAL | Status: DC
Start: ? — End: 2018-04-29

## 2018-04-30 ENCOUNTER — Encounter: Payer: 59 | Admitting: Surgery

## 2018-05-06 DIAGNOSIS — E2749 Other adrenocortical insufficiency: Secondary | ICD-10-CM | POA: Diagnosis not present

## 2018-05-06 DIAGNOSIS — D7582 Heparin induced thrombocytopenia (HIT): Secondary | ICD-10-CM | POA: Diagnosis not present

## 2018-05-06 DIAGNOSIS — I82403 Acute embolism and thrombosis of unspecified deep veins of lower extremity, bilateral: Secondary | ICD-10-CM | POA: Diagnosis not present

## 2018-05-08 DIAGNOSIS — D7582 Heparin induced thrombocytopenia (HIT): Secondary | ICD-10-CM | POA: Diagnosis not present

## 2018-05-08 DIAGNOSIS — I480 Paroxysmal atrial fibrillation: Secondary | ICD-10-CM | POA: Diagnosis not present

## 2018-05-08 DIAGNOSIS — I499 Cardiac arrhythmia, unspecified: Secondary | ICD-10-CM | POA: Diagnosis not present

## 2018-05-08 DIAGNOSIS — Z86718 Personal history of other venous thrombosis and embolism: Secondary | ICD-10-CM | POA: Diagnosis not present

## 2018-05-08 DIAGNOSIS — D696 Thrombocytopenia, unspecified: Secondary | ICD-10-CM | POA: Diagnosis not present

## 2018-05-09 DIAGNOSIS — E2740 Unspecified adrenocortical insufficiency: Secondary | ICD-10-CM | POA: Diagnosis not present

## 2018-05-13 DIAGNOSIS — K219 Gastro-esophageal reflux disease without esophagitis: Secondary | ICD-10-CM | POA: Diagnosis not present

## 2018-05-13 DIAGNOSIS — R05 Cough: Secondary | ICD-10-CM | POA: Diagnosis not present

## 2018-05-13 DIAGNOSIS — I480 Paroxysmal atrial fibrillation: Secondary | ICD-10-CM | POA: Diagnosis not present

## 2018-05-17 ENCOUNTER — Emergency Department: Payer: 59

## 2018-05-17 ENCOUNTER — Emergency Department
Admission: EM | Admit: 2018-05-17 | Discharge: 2018-05-17 | Disposition: A | Discharge status: Home / Self Care | Payer: 59 | Attending: Emergency Medicine | Admitting: Emergency Medicine

## 2018-05-17 ENCOUNTER — Other Ambulatory Visit: Payer: Self-pay

## 2018-05-17 DIAGNOSIS — R062 Wheezing: Secondary | ICD-10-CM | POA: Diagnosis not present

## 2018-05-17 DIAGNOSIS — R05 Cough: Secondary | ICD-10-CM | POA: Insufficient documentation

## 2018-05-17 DIAGNOSIS — R0781 Pleurodynia: Secondary | ICD-10-CM | POA: Diagnosis not present

## 2018-05-17 DIAGNOSIS — J189 Pneumonia, unspecified organism: Secondary | ICD-10-CM | POA: Diagnosis not present

## 2018-05-17 DIAGNOSIS — R0789 Other chest pain: Secondary | ICD-10-CM

## 2018-05-17 DIAGNOSIS — Z79899 Other long term (current) drug therapy: Secondary | ICD-10-CM | POA: Diagnosis not present

## 2018-05-17 DIAGNOSIS — R0602 Shortness of breath: Secondary | ICD-10-CM | POA: Insufficient documentation

## 2018-05-17 DIAGNOSIS — D696 Thrombocytopenia, unspecified: Secondary | ICD-10-CM

## 2018-05-17 DIAGNOSIS — M546 Pain in thoracic spine: Secondary | ICD-10-CM | POA: Diagnosis not present

## 2018-05-17 DIAGNOSIS — M549 Dorsalgia, unspecified: Secondary | ICD-10-CM | POA: Diagnosis present

## 2018-05-17 DIAGNOSIS — R059 Cough, unspecified: Secondary | ICD-10-CM

## 2018-05-17 HISTORY — DX: Unspecified atrial fibrillation: I48.91

## 2018-05-17 LAB — BASIC METABOLIC PANEL
Anion gap: 6 (ref 5–15)
BUN: 35 mg/dL — ABNORMAL HIGH (ref 6–20)
CHLORIDE: 112 mmol/L — AB (ref 98–111)
CO2: 20 mmol/L — AB (ref 22–32)
CREATININE: 1.22 mg/dL (ref 0.61–1.24)
Calcium: 9 mg/dL (ref 8.9–10.3)
GFR calc non Af Amer: >60 mL/min (ref >60)
Glucose, Bld: 134 mg/dL — ABNORMAL HIGH (ref 70–99)
Potassium: 4.5 mmol/L (ref 3.5–5.1)
Sodium: 138 mmol/L (ref 135–145)

## 2018-05-17 LAB — CBC
HCT: 41.5 % (ref 40.0–52.0)
Hemoglobin: 14.5 g/dL (ref 13.0–18.0)
MCH: 33.6 pg (ref 26.0–34.0)
MCHC: 35 g/dL (ref 32.0–36.0)
MCV: 96.1 fL (ref 80.0–100.0)
PLATELETS: 108 10*3/uL — AB (ref 150–440)
RBC: 4.32 MIL/uL — AB (ref 4.40–5.90)
RDW: 15.1 % — ABNORMAL HIGH (ref 11.5–14.5)
WBC: 7.9 10*3/uL (ref 3.8–10.6)

## 2018-05-17 LAB — TROPONIN I: Troponin I: <0.03 ng/mL (ref <0.03)

## 2018-05-17 MED ORDER — ALBUTEROL SULFATE (2.5 MG/3ML) 0.083% IN NEBU
5.0000 mg | INHALATION_SOLUTION | Freq: Once | RESPIRATORY_TRACT | Status: AC
  Administered 2018-05-17: 5 mg via RESPIRATORY_TRACT
  Filled 2018-05-17: qty 6

## 2018-05-17 MED ORDER — ALBUTEROL SULFATE HFA 108 (90 BASE) MCG/ACT IN AERS: 2.0000 | INHALATION_SPRAY | Freq: Four times a day (QID) | RESPIRATORY_TRACT | 2 refills | Status: AC | PRN

## 2018-05-17 MED ORDER — IOHEXOL 350 MG/ML SOLN
75.0000 mL | Freq: Once | INTRAVENOUS | Status: AC | PRN
  Administered 2018-05-17: 75 mL via INTRAVENOUS

## 2018-05-17 NOTE — Discharge Instructions (Signed)
Please follow-up closely with your primary care physician in the next two days.  Your platelet count is low, needs to be rechecked.  Continue taking the Pradaxa.  Be careful of any bleeding, do not hit your head, avoid contacts or dangerous activities which could cause bleeding.  CT scan, as we discussed, is somewhat limited however does not show any evidence of an acute PE at this time.  That could be repeated if your symptoms worsen.  If you have chest pain or shortness of breath or any other concerns especially concerns of a worsening nature return immediately to the emergency department.  Otherwise follow closely with your primary care doctor.

## 2018-05-17 NOTE — ED Notes (Signed)
Breathing better  After neb tx    Ct scan done

## 2018-05-17 NOTE — ED Triage Notes (Addendum)
Pt arrives from [facility]. Sent over to r/o PE. Recent pneumonia, still on antibiotics. States recent adrenal gland crisis "bilateral catastrophic adrenal gland bleed". Hx of PE's in organs. Takes 150mg  pradaxa. Denies CP. Only pain is in L lung. Alert, oriented. Speaking in clear sentences. No resp distress noted.

## 2018-05-17 NOTE — ED Provider Notes (Signed)
[facility] Emergency Department Provider Note  ____________________________________________   I have reviewed the triage vital signs and the nursing notes. Where available I have reviewed prior notes and, if possible and indicated, outside hospital notes.    HISTORY  Chief Complaint Pneumonia    HPI Alexander [name]. is a 52 y.o. male  Who presents today complaining of pain in his back when he coughs.  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  He is compliant with his medication.  However, he recently was diagnosed with a cough, has been given steroids as well as antibiotics.  The cough seems to be getting better he still has a residual wheeze but he is concerned that it hurts when he coughs in the back ribs.  He denies any fever or chills he has no significant shortness of breath.  He is compliant with all of his medications including his coagulants.  He was sent here to rule out PE the pain is in the left rib cage, hurts when he touches it, it is in his back.  No other alleviating or aggravating factors worse when he changes position or coughs.  He was like a muscle ache to him.  When he is not actually coughing it does not hurt.  He does not feel significantly dyspneic.     Past Medical History:  Diagnosis Date  . Atrial fibrillation (HCC)   . DVT (deep venous thrombosis) (HCC)   . History of pulmonary embolus (PE)     Patient Active Problem List   Diagnosis Date Noted  . Antiphospholipid antibody syndrome (HCC) 04/15/2018  . Adrenal hemorrhage (HCC)   . Ileus (HCC) 04/08/2018  . Primary hypercoagulable state (HCC) 05/21/2017  . Acute deep vein thrombosis (DVT) of distal vein of left lower extremity (HCC) 10/09/2016  . Pulmonary emboli (HCC) 06/27/2016    Past Surgical History:  Procedure Laterality Date  . NASAL FRACTURE SURGERY    . VASECTOMY      Prior to Admission medications   Medication Sig Start Date End Date Taking?  Authorizing Provider  dicyclomine (BENTYL) 20 MG tablet Take 1 tablet (20 mg total) by mouth 2 (two) times daily as needed for spasms. 04/06/18   Loren Racer, MD  docusate sodium (COLACE) 100 MG capsule Take 1 capsule (100 mg total) by mouth every 12 (twelve) hours. 04/06/18   Loren Racer, MD  enoxaparin (LOVENOX) 100 MG/ML injection Inject 1 mL (100 mg total) into the skin every 12 (twelve) hours. 04/11/18   Earna Coder, MD  ondansetron (ZOFRAN) 4 MG tablet Take 1 tablet (4 mg total) by mouth every 6 (six) hours as needed for nausea or vomiting. 04/06/18   Loren Racer, MD  polyethylene glycol (MIRALAX / Ethelene Hal) packet Take 17 g by mouth daily. Patient taking differently: Take 17 g by mouth daily as needed for mild constipation.  04/06/18   Loren Racer, MD  traMADol (ULTRAM) 50 MG tablet Take 1 tablet (50 mg total) by mouth every 6 (six) hours as needed. 04/11/18   Houston Siren, MD    Allergies Codeine; Eliquis [apixaban]; Heparin; Lovenox [enoxaparin sodium]; Oxycodone-acetaminophen; and Tramadol  Family History  Problem Relation Age of Onset  . Heart attack Father   . Stroke Father   . Cancer Father 36       Prostate &/or bladder    Social History Social History   Tobacco Use  . Smoking status: Never Smoker  .  Smokeless tobacco: Never Used  Substance Use Topics  . 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 Drug use: No    Review of Systems Constitutional: No fever/chills Eyes: No visual changes. ENT: No sore throat. No stiff neck no neck pain Cardiovascular: Denies chest pain. Respiratory: + cough Gastrointestinal:   no vomiting.  No diarrhea.  No constipation. Genitourinary: Negative for dysuria. Musculoskeletal: Negative lower extremity swelling Skin: Negative for rash. Neurological: Negative for severe headaches, focal weakness or numbness.   ____________________________________________   PHYSICAL EXAM:  VITAL  SIGNS: ED Triage Vitals  Enc Vitals Group     BP 05/17/18 1319 114/77     Pulse Rate 05/17/18 1319 75     Resp 05/17/18 1319 16     Temp 05/17/18 1319 98.2 F (36.8 C)     Temp Source 05/17/18 1319 Oral     SpO2 05/17/18 1319 97 %     Weight 05/17/18 1322 210 lb (95.3 kg)     Height 05/17/18 1322 5\' 10"  (1.778 m)     Head Circumference --      Peak Flow --      Pain Score 05/17/18 1320 5     Pain Loc --      Pain Edu? --      Excl. in GC? --     Constitutional: Alert and oriented. Well appearing and in no acute distress. Eyes: Conjunctivae are normal Head: Atraumatic HEENT: No congestion/rhinnorhea. Mucous membranes are moist.  Oropharynx non-erythematous Neck:   Nontender with no meningismus, no masses, no stridor Cardiovascular: Normal rate, regular rhythm. Grossly normal heart sounds.  Good peripheral circulation. Respiratory: Normal respiratory effort.  No retractions.  Slight wheeze noted on the left side, abdominal: Soft and nontender. No distention. No guarding no rebound Chest: Palpation in the left rib cage in the back, not near the spine, lateral.  No rib fractures palpated, there is no crepitus there is no flail chest, when I touch this area he states "ouch that the pain right there" and pulls back.  No CVA tenderness. back:  There is no focal tenderness or step off.  there is no midline tenderness there are no lesions noted. there is no CVA tenderness Musculoskeletal: No lower extremity tenderness, no upper extremity tenderness. No joint effusions, no DVT signs strong distal pulses no edema Neurologic:  Normal speech and language. No gross focal neurologic deficits are appreciated.  Skin:  Skin is warm, dry and intact. No rash noted. Psychiatric: Mood and affect are normal. Speech and behavior are normal.  ____________________________________________   LABS (all labs ordered are listed, but only abnormal results are displayed)  Labs Reviewed  BASIC METABOLIC PANEL  - Abnormal; Notable for the following components:      Result Value   Chloride 112 (*)    CO2 20 (*)    Glucose, Bld 134 (*)    BUN 35 (*)    All other components within normal limits  CBC - Abnormal; Notable for the following components:   RBC 4.32 (*)    RDW 15.1 (*)    Platelets 108 (*)    All other components within normal limits  TROPONIN I    Pertinent labs  results that were available during my care of the patient were reviewed by me and considered in my medical decision making (see chart for details). ____________________________________________  EKG  I personally interpreted any EKGs ordered by me or triage Sinus rhythm  rate 76 bpm, there are flipped T waves noted in V1 no other acute ischemic changes nonspecific ST changing flipped T waves noted in 3 as well isolated.  No significant change from prior EKG ____________________________________________  RADIOLOGY  Pertinent labs & imaging results that were available during my care of the patient were reviewed by me and considered in my medical decision making (see chart for details). If possible, patient and/or family made aware of any abnormal findings.  Dg Chest 2 View  Result Date: 05/17/2018 CLINICAL DATA:  Recent diagnosis of pneumonia. Worsening of symptoms. EXAM: CHEST - 2 VIEW COMPARISON:  Chest CT 04/09/2018 FINDINGS: Cardiomediastinal silhouette is normal. Mediastinal contours appear intact. There is no evidence of pleural effusion or pneumothorax. Mild volume loss in the right hemithorax. Subtle linear airspace opacities in the right middle and lower lobes. Osseous structures are without acute abnormality. Soft tissues are grossly normal. IMPRESSION: Subtle linear airspace opacities in the right middle and lower lobes, may represent peribronchial airspace consolidation or atelectasis. Electronically Signed   By: Ted Mcalpine M.D.   On: 05/17/2018 14:07   Ct Angio Chest Pe W And/or Wo Contrast  Result Date:  05/17/2018 CLINICAL DATA:  Shortness of breath. Recent pneumonia. Patient on antibiotics. Recent adrenal crisis with adrenal hemorrhage. EXAM: CT ANGIOGRAPHY CHEST WITH CONTRAST TECHNIQUE: Multidetector CT imaging of the chest was performed using the standard protocol during bolus administration of intravenous contrast. Multiplanar CT image reconstructions and MIPs were obtained to evaluate the vascular anatomy. CONTRAST:  75mL OMNIPAQUE IOHEXOL 350 MG/ML SOLN COMPARISON:  None. FINDINGS: Cardiovascular: Heart is unchanged. The coronary arteries or grossly unremarkable. The thoracic aorta is nonaneurysmal with no atherosclerosis or dissection. Timing of contrast is not optimized. There is more contrast in the aorta than the pulmonary artery. There is also respiratory motion delete which limits evaluation with stairstep artifact. No pulmonary emboli in the main pulmonary arteries. Evaluation beyond the main pulmonary arteries is nearly duct nondiagnostic. Mediastinum/Nodes: No enlarged mediastinal, hilar, or axillary lymph nodes. Thyroid gland, trachea, and esophagus demonstrate no significant findings. Lungs/Pleura: The central airways are normal. No pneumothorax. No pulmonary nodules, masses, or focal infiltrates. Upper Abdomen: The adrenal glands were not imaged to assess the previous hemorrhages. Visualized portions of the upper abdomen are unremarkable. Musculoskeletal: No acute bony abnormalities identified. Review of the MIP images confirms the above findings. IMPRESSION: 1. No pulmonary emboli in the main pulmonary arteries. Evaluation distal to level of the main pulmonary arteries is nondiagnostic due to respiratory motion and timing of contrast. If concern persists, a repeat study when the patient is better able to cooperate with breathing instructions or a V/Q scan could further evaluate. Electronically Signed   By: Gerome Sam III M.D   On: 05/17/2018 15:21    ____________________________________________    PROCEDURES  Procedure(s) performed: None  Procedures  Critical Care performed: None  ____________________________________________   INITIAL IMPRESSION / ASSESSMENT AND PLAN / ED COURSE  Pertinent labs & imaging results that were available during my care of the patient were reviewed by me and considered in my medical decision making (see chart for details).  Most likely infectious etiology for an cough and wheeze.  He is doing much better at home in all respects except for he has pain when he coughs.  When I touch the area it hurts, nonetheless given history of PE, Alexander though he is on his blood thinners and compliant with them, I did a CT scan which showed no central PE  but unfortunately patient took a breath during the imaging.  This leads [place] to either giving him another dye load, keeping him here for a VQ scan, or being satisfied with that the testing likely is sufficient given that there is no central PE.  I did explain all these options to the patient he very much would prefer to go home without further imaging which I do not think is unreasonable.  If he does have a small peripheral PE would not change therapy at this time given his Pradaxa is already something he is compliant with.  Normal unchanged significantly as destination in terms of disposition.  Nonetheless, patient does have cough and wheeze and he did better after nebulizer his lungs are clear here, will send him home with albuterol he will continue taking his steroids.  I made him aware of his lower platelet count.  He states he has been lower than not before and this is recurrent for him.  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  Patient very comfortable with this plan and eager to go home.    ____________________________________________   FINAL CLINICAL IMPRESSION(S) / ED DIAGNOSES  Final diagnoses:  None      This chart was dictated using voice recognition software.  Despite best efforts to proofread,  errors can occur which can change meaning.      Jeanmarie Plant, MD 05/17/18 [phone]

## 2018-05-17 NOTE — ED Triage Notes (Signed)
FIRST NURSE NOTE-has been having SHOB. Hx blood clots. Takes predaxa.  Sent for r/o PE. Recent PNA as well. Unlabored currently. NAD

## 2018-05-17 NOTE — ED Notes (Signed)
Blue top sent to lab as well.  

## 2018-05-17 NOTE — ED Notes (Signed)
Pt has productive greenish yellow   Also  Has back pain l side worse when he coughs

## 2018-05-17 NOTE — ED Notes (Signed)
Pt placed in subwait area after xray.

## 2018-05-19 DIAGNOSIS — D696 Thrombocytopenia, unspecified: Secondary | ICD-10-CM | POA: Diagnosis not present

## 2018-05-22 DIAGNOSIS — I480 Paroxysmal atrial fibrillation: Secondary | ICD-10-CM | POA: Diagnosis not present

## 2018-05-22 DIAGNOSIS — I82403 Acute embolism and thrombosis of unspecified deep veins of lower extremity, bilateral: Secondary | ICD-10-CM | POA: Diagnosis not present

## 2018-05-22 DIAGNOSIS — D696 Thrombocytopenia, unspecified: Secondary | ICD-10-CM | POA: Diagnosis not present

## 2018-05-28 DIAGNOSIS — R931 Abnormal findings on diagnostic imaging of heart and coronary circulation: Secondary | ICD-10-CM | POA: Diagnosis not present

## 2018-05-28 DIAGNOSIS — I77819 Aortic ectasia, unspecified site: Secondary | ICD-10-CM | POA: Diagnosis not present

## 2018-05-28 DIAGNOSIS — I480 Paroxysmal atrial fibrillation: Secondary | ICD-10-CM | POA: Diagnosis not present

## 2018-06-03 DIAGNOSIS — I824Y1 Acute embolism and thrombosis of unspecified deep veins of right proximal lower extremity: Secondary | ICD-10-CM | POA: Diagnosis not present

## 2018-06-03 DIAGNOSIS — M7989 Other specified soft tissue disorders: Secondary | ICD-10-CM | POA: Diagnosis not present

## 2018-06-03 DIAGNOSIS — D6861 Antiphospholipid syndrome: Secondary | ICD-10-CM | POA: Diagnosis not present

## 2018-06-04 DIAGNOSIS — Z86711 Personal history of pulmonary embolism: Secondary | ICD-10-CM | POA: Diagnosis not present

## 2018-06-04 DIAGNOSIS — E278 Other specified disorders of adrenal gland: Secondary | ICD-10-CM | POA: Diagnosis not present

## 2018-06-04 DIAGNOSIS — M7989 Other specified soft tissue disorders: Secondary | ICD-10-CM | POA: Diagnosis not present

## 2018-06-04 DIAGNOSIS — I82411 Acute embolism and thrombosis of right femoral vein: Secondary | ICD-10-CM | POA: Diagnosis not present

## 2018-06-04 DIAGNOSIS — I824Y1 Acute embolism and thrombosis of unspecified deep veins of right proximal lower extremity: Secondary | ICD-10-CM | POA: Diagnosis not present

## 2018-06-04 DIAGNOSIS — D6861 Antiphospholipid syndrome: Secondary | ICD-10-CM | POA: Diagnosis not present

## 2018-06-04 DIAGNOSIS — E2749 Other adrenocortical insufficiency: Secondary | ICD-10-CM | POA: Diagnosis not present

## 2018-06-04 DIAGNOSIS — D696 Thrombocytopenia, unspecified: Secondary | ICD-10-CM | POA: Diagnosis not present

## 2018-06-04 DIAGNOSIS — I480 Paroxysmal atrial fibrillation: Secondary | ICD-10-CM | POA: Diagnosis not present

## 2018-06-04 DIAGNOSIS — I82401 Acute embolism and thrombosis of unspecified deep veins of right lower extremity: Secondary | ICD-10-CM | POA: Diagnosis not present

## 2018-06-11 DIAGNOSIS — I82403 Acute embolism and thrombosis of unspecified deep veins of lower extremity, bilateral: Secondary | ICD-10-CM | POA: Diagnosis not present

## 2018-06-13 DIAGNOSIS — I82403 Acute embolism and thrombosis of unspecified deep veins of lower extremity, bilateral: Secondary | ICD-10-CM | POA: Diagnosis not present

## 2018-06-18 DIAGNOSIS — I480 Paroxysmal atrial fibrillation: Secondary | ICD-10-CM | POA: Diagnosis not present

## 2018-06-20 DIAGNOSIS — I824Y3 Acute embolism and thrombosis of unspecified deep veins of proximal lower extremity, bilateral: Secondary | ICD-10-CM | POA: Diagnosis not present

## 2018-06-27 DIAGNOSIS — I824Y3 Acute embolism and thrombosis of unspecified deep veins of proximal lower extremity, bilateral: Secondary | ICD-10-CM | POA: Diagnosis not present

## 2018-07-03 DIAGNOSIS — I480 Paroxysmal atrial fibrillation: Secondary | ICD-10-CM | POA: Diagnosis not present

## 2018-07-16 DIAGNOSIS — E2749 Other adrenocortical insufficiency: Secondary | ICD-10-CM | POA: Diagnosis not present

## 2018-07-18 DIAGNOSIS — E2749 Other adrenocortical insufficiency: Secondary | ICD-10-CM | POA: Diagnosis not present

## 2018-07-18 DIAGNOSIS — R791 Abnormal coagulation profile: Secondary | ICD-10-CM | POA: Diagnosis not present

## 2018-07-18 DIAGNOSIS — D6861 Antiphospholipid syndrome: Secondary | ICD-10-CM | POA: Diagnosis not present

## 2018-07-18 DIAGNOSIS — L509 Urticaria, unspecified: Secondary | ICD-10-CM | POA: Diagnosis not present

## 2018-07-21 DIAGNOSIS — L509 Urticaria, unspecified: Secondary | ICD-10-CM | POA: Diagnosis not present

## 2018-07-22 DIAGNOSIS — R791 Abnormal coagulation profile: Secondary | ICD-10-CM | POA: Diagnosis not present

## 2018-07-29 DIAGNOSIS — I824Y3 Acute embolism and thrombosis of unspecified deep veins of proximal lower extremity, bilateral: Secondary | ICD-10-CM | POA: Diagnosis not present

## 2018-08-11 DIAGNOSIS — Z86711 Personal history of pulmonary embolism: Secondary | ICD-10-CM | POA: Diagnosis not present

## 2018-08-11 DIAGNOSIS — L509 Urticaria, unspecified: Secondary | ICD-10-CM | POA: Diagnosis not present

## 2018-08-11 DIAGNOSIS — Z86718 Personal history of other venous thrombosis and embolism: Secondary | ICD-10-CM | POA: Diagnosis not present

## 2018-08-11 DIAGNOSIS — R768 Other specified abnormal immunological findings in serum: Secondary | ICD-10-CM | POA: Diagnosis not present

## 2018-08-15 DIAGNOSIS — I480 Paroxysmal atrial fibrillation: Secondary | ICD-10-CM | POA: Diagnosis not present

## 2018-08-15 DIAGNOSIS — I82403 Acute embolism and thrombosis of unspecified deep veins of lower extremity, bilateral: Secondary | ICD-10-CM | POA: Diagnosis not present

## 2018-09-15 DIAGNOSIS — I824Y3 Acute embolism and thrombosis of unspecified deep veins of proximal lower extremity, bilateral: Secondary | ICD-10-CM | POA: Diagnosis not present

## 2018-09-18 DIAGNOSIS — D6861 Antiphospholipid syndrome: Secondary | ICD-10-CM | POA: Diagnosis not present

## 2018-09-22 DIAGNOSIS — I82403 Acute embolism and thrombosis of unspecified deep veins of lower extremity, bilateral: Secondary | ICD-10-CM | POA: Diagnosis not present

## 2018-09-22 DIAGNOSIS — E2740 Unspecified adrenocortical insufficiency: Secondary | ICD-10-CM | POA: Diagnosis not present

## 2018-09-26 DIAGNOSIS — E2749 Other adrenocortical insufficiency: Secondary | ICD-10-CM | POA: Diagnosis not present

## 2018-10-17 DIAGNOSIS — E273 Drug-induced adrenocortical insufficiency: Secondary | ICD-10-CM | POA: Diagnosis not present

## 2018-10-17 DIAGNOSIS — E2740 Unspecified adrenocortical insufficiency: Secondary | ICD-10-CM | POA: Diagnosis not present

## 2018-10-17 DIAGNOSIS — I824Y3 Acute embolism and thrombosis of unspecified deep veins of proximal lower extremity, bilateral: Secondary | ICD-10-CM | POA: Diagnosis not present

## 2018-10-17 DIAGNOSIS — T380X5D Adverse effect of glucocorticoids and synthetic analogues, subsequent encounter: Secondary | ICD-10-CM | POA: Diagnosis not present

## 2018-11-13 DIAGNOSIS — I824Y3 Acute embolism and thrombosis of unspecified deep veins of proximal lower extremity, bilateral: Secondary | ICD-10-CM | POA: Diagnosis not present

## 2018-11-17 DIAGNOSIS — E2740 Unspecified adrenocortical insufficiency: Secondary | ICD-10-CM | POA: Diagnosis not present

## 2018-12-15 DIAGNOSIS — I824Y3 Acute embolism and thrombosis of unspecified deep veins of proximal lower extremity, bilateral: Secondary | ICD-10-CM | POA: Diagnosis not present

## 2018-12-22 DIAGNOSIS — R791 Abnormal coagulation profile: Secondary | ICD-10-CM | POA: Diagnosis not present

## 2019-01-07 DIAGNOSIS — I824Y3 Acute embolism and thrombosis of unspecified deep veins of proximal lower extremity, bilateral: Secondary | ICD-10-CM | POA: Diagnosis not present

## 2019-01-12 DIAGNOSIS — J028 Acute pharyngitis due to other specified organisms: Secondary | ICD-10-CM | POA: Diagnosis not present

## 2019-01-16 DIAGNOSIS — E2740 Unspecified adrenocortical insufficiency: Secondary | ICD-10-CM | POA: Diagnosis not present

## 2019-01-19 DIAGNOSIS — J358 Other chronic diseases of tonsils and adenoids: Secondary | ICD-10-CM | POA: Diagnosis not present

## 2019-08-10 ENCOUNTER — Other Ambulatory Visit: Payer: Self-pay | Admitting: Family Medicine

## 2019-08-10 DIAGNOSIS — R519 Headache, unspecified: Secondary | ICD-10-CM

## 2019-08-15 ENCOUNTER — Ambulatory Visit: Payer: 59

## 2019-09-08 IMAGING — CT CT ABD-PELV W/ CM
2 of 5 series · 16 of 46 positions shown, 18 images · IV contrast (iopamidol)
Comparison: CT 11/05/2010, ultrasound 04/05/2018

CLINICAL DATA: Right-sided abdominal pain

EXAM:
CT ABDOMEN AND PELVIS WITH CONTRAST
TECHNIQUE: Multidetector CT imaging of the abdomen and pelvis was performed
using the standard protocol following bolus administration of
intravenous contrast.
CONTRAST:  100mL AVNN4X-FPP IOPAMIDOL (AVNN4X-FPP) INJECTION 61%

[Series 2: routine abd/pel with · axial · 0.77mm/px · z∈[-549,-99]mm · 13 of 102 slices shown, 15 images]
[im 6/102  soft-tissue]
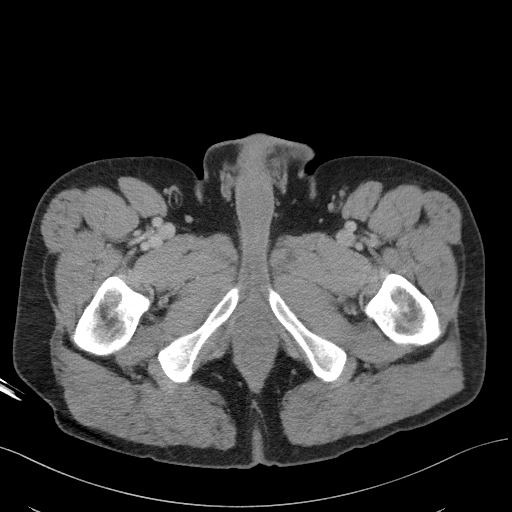
[im 6/102  bone]
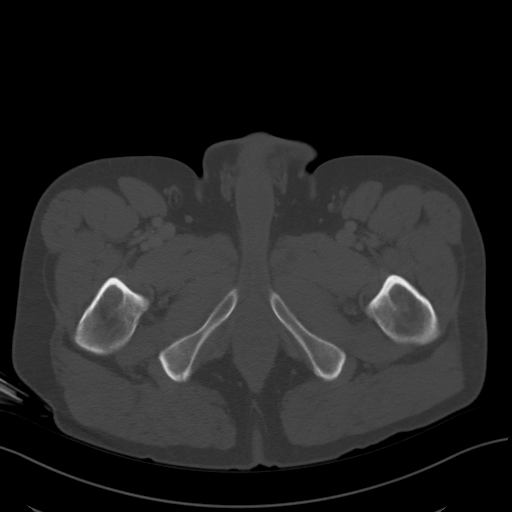
[im 16/102  soft-tissue]
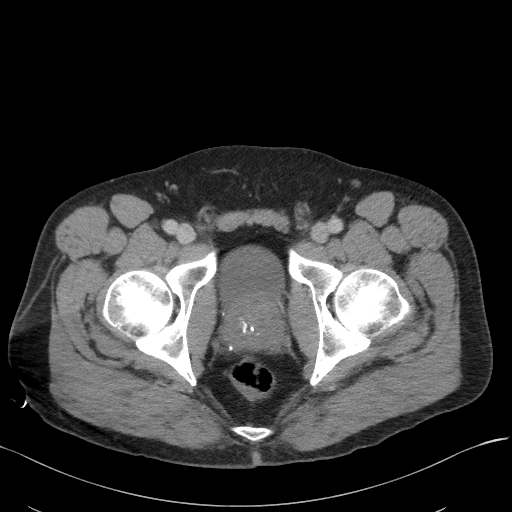
[im 22/102  soft-tissue]
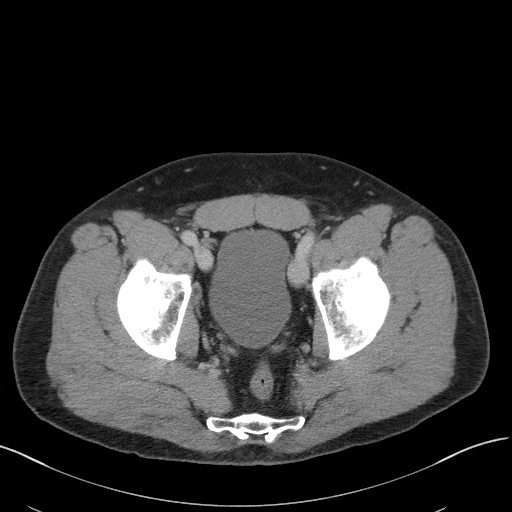
[im 27/102  soft-tissue]
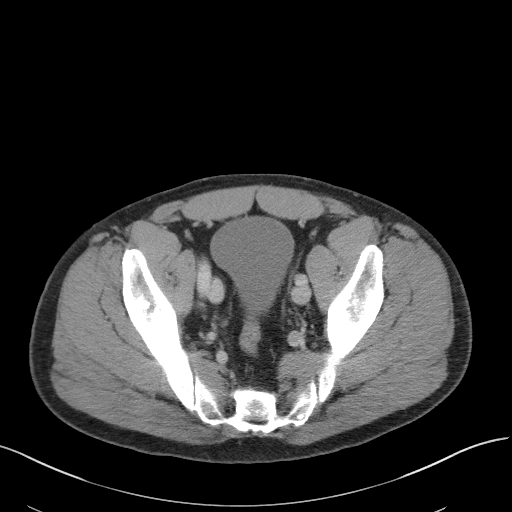
[im 38/102  soft-tissue]
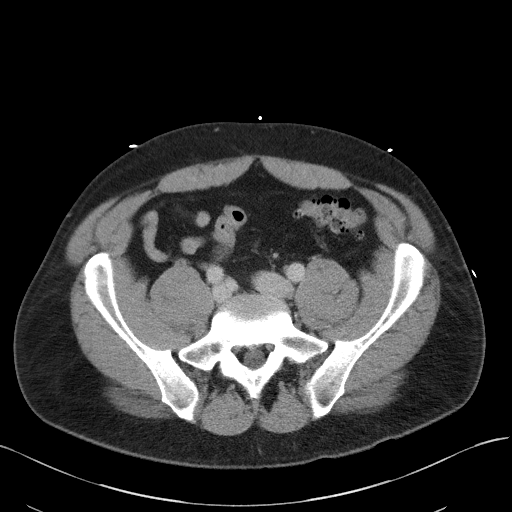
[im 43/102  soft-tissue]
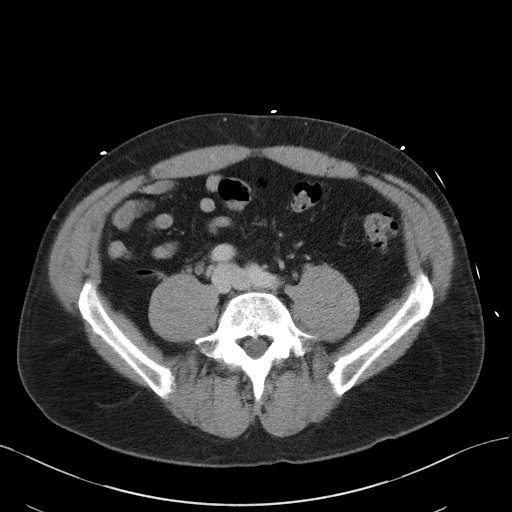
[im 54/102  soft-tissue]
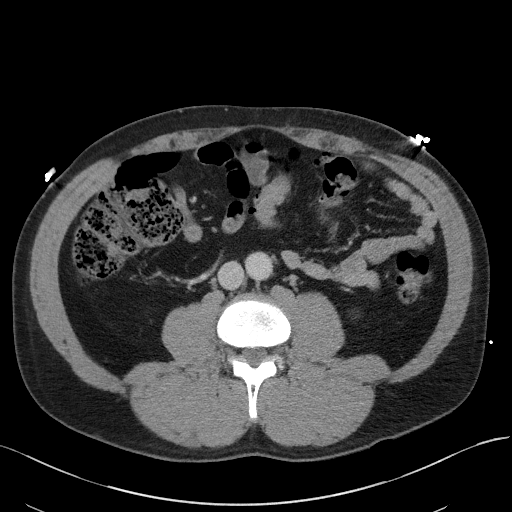
[im 59/102  soft-tissue]
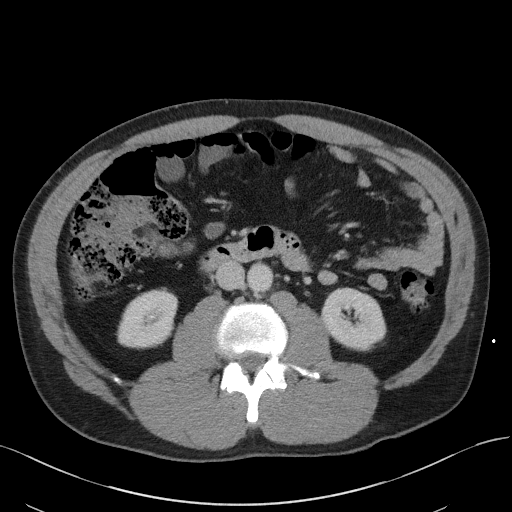
[im 64/102  soft-tissue]
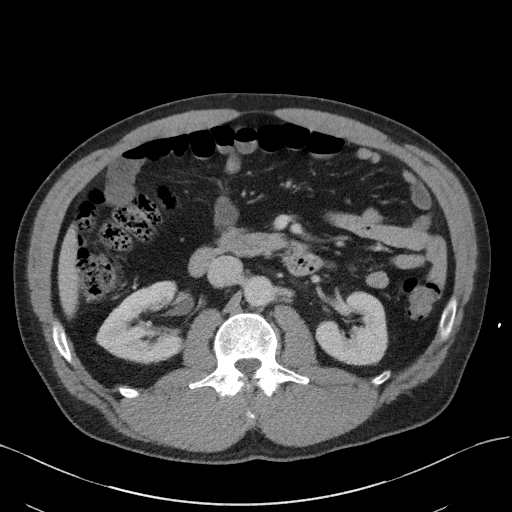
[im 64/102  bone]
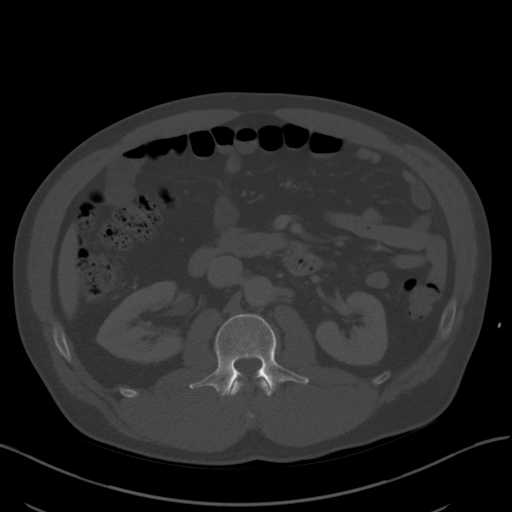
[im 75/102  soft-tissue]
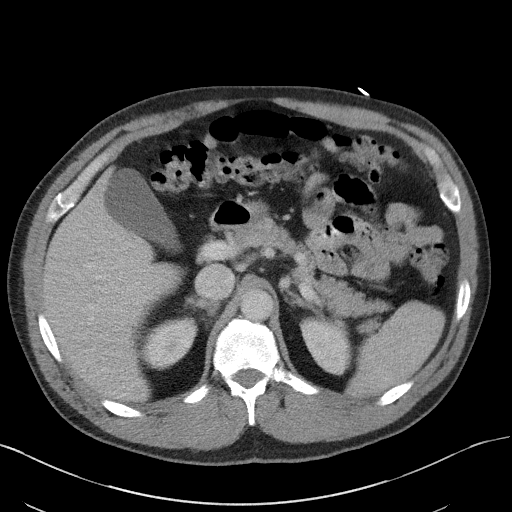
[im 80/102  soft-tissue]
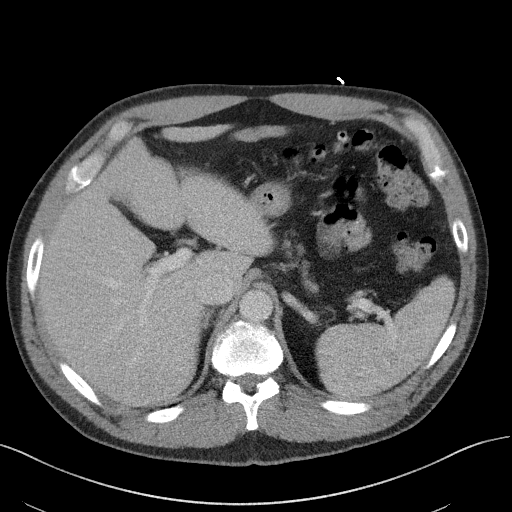
[im 86/102  soft-tissue]
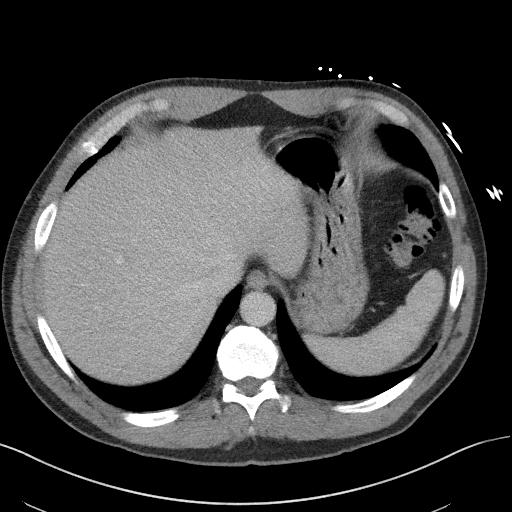
[im 96/102  soft-tissue]
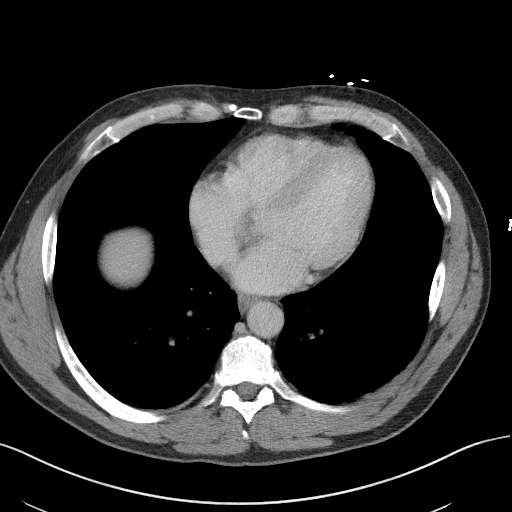

[Series 5: coronal st · coronal · 0.74mm/px · 3 of 104 slices shown]
[im 35/104  soft-tissue]
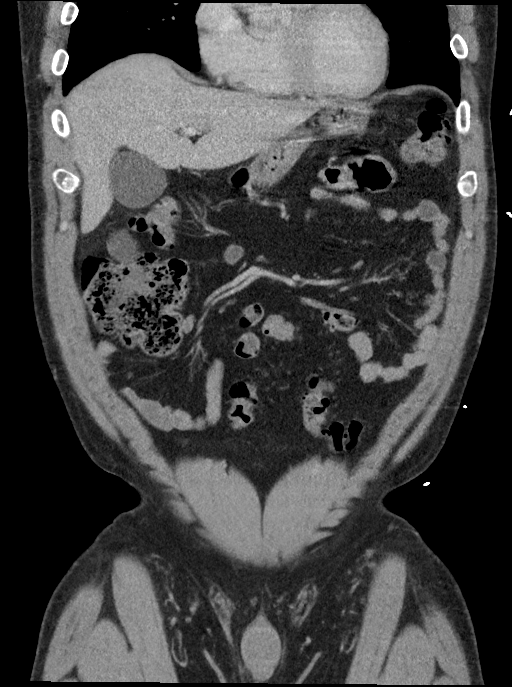
[im 46/104  soft-tissue]
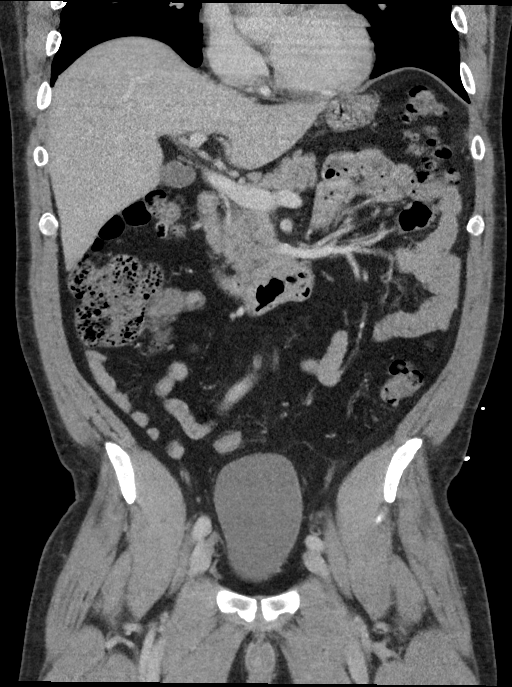
[im 58/104  soft-tissue]
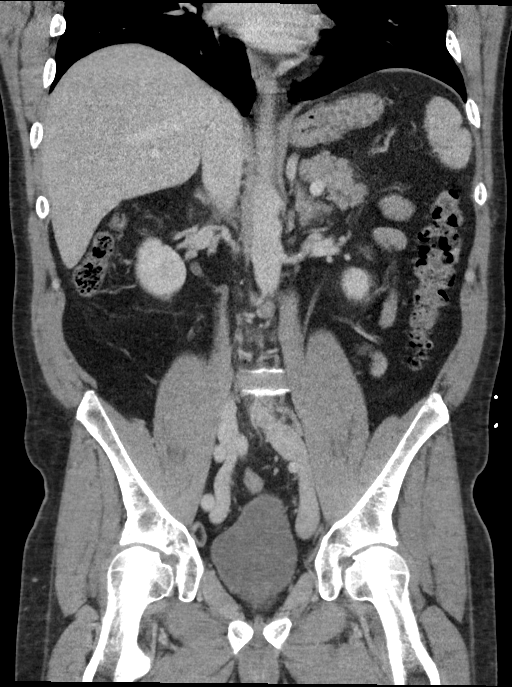

[16 of 46 positions shown; findings below may reference images not displayed]

FINDINGS: Lower chest: Lung bases demonstrate no acute consolidation or
effusion. The heart size is within normal limits

Hepatobiliary: No focal liver abnormality is seen. No gallstones,
gallbladder wall thickening, or biliary dilatation.

Pancreas: Unremarkable. No pancreatic ductal dilatation or
surrounding inflammatory changes.

Spleen: Normal in size without focal abnormality.

Adrenals/Urinary Tract: Adrenal glands are unremarkable. Kidneys are
normal, without renal calculi, focal lesion, or hydronephrosis.
Bladder is unremarkable.

Stomach/Bowel: Stomach is within normal limits. Appendix appears
normal. No evidence of bowel wall thickening, distention, or
inflammatory changes. Large stool in the cecum. Sigmoid colon
diverticular disease without acute inflammation

Vascular/Lymphatic: No significant vascular findings are present. No
enlarged abdominal or pelvic lymph nodes.

Reproductive: Enlarged prostate with calcification

Other: Negative for free air or free fluid.

Musculoskeletal: No acute or significant osseous findings.
IMPRESSION: 1. No CT evidence for acute intra-abdominal or pelvic abnormality.
2. Sigmoid colon diverticular disease without acute inflammation
3. Prostatomegaly

## 2022-08-08 ENCOUNTER — Encounter: Payer: Self-pay | Admitting: Orthopedic Surgery

## 2022-08-08 ENCOUNTER — Other Ambulatory Visit: Payer: Self-pay | Admitting: Orthopedic Surgery

## 2022-08-08 DIAGNOSIS — G8929 Other chronic pain: Secondary | ICD-10-CM

## 2022-08-08 DIAGNOSIS — M19012 Primary osteoarthritis, left shoulder: Secondary | ICD-10-CM

## 2022-08-10 ENCOUNTER — Ambulatory Visit
Admission: RE | Admit: 2022-08-10 | Discharge: 2022-08-10 | Disposition: A | Discharge status: Home / Self Care | Payer: 59 | Source: Ambulatory Visit | Attending: Orthopedic Surgery | Admitting: Orthopedic Surgery

## 2022-08-10 DIAGNOSIS — G8929 Other chronic pain: Secondary | ICD-10-CM

## 2022-08-10 DIAGNOSIS — M25512 Pain in left shoulder: Secondary | ICD-10-CM | POA: Insufficient documentation

## 2022-08-10 DIAGNOSIS — M19012 Primary osteoarthritis, left shoulder: Secondary | ICD-10-CM | POA: Diagnosis not present

## 2022-08-23 ENCOUNTER — Other Ambulatory Visit: Payer: Self-pay | Admitting: Orthopedic Surgery

## 2022-08-28 ENCOUNTER — Inpatient Hospital Stay: Admission: RE | Admit: 2022-08-28 | Payer: 59 | Source: Ambulatory Visit

## 2022-09-11 ENCOUNTER — Ambulatory Visit: Admit: 2022-09-11 | Payer: 59 | Admitting: Orthopedic Surgery

## 2022-09-11 SURGERY — ARTHROPLASTY, SHOULDER, TOTAL
Anesthesia: Choice | Site: Shoulder | Laterality: Left
